# Patient Record
Sex: Male | Born: 1945 | Race: Black or African American | Hispanic: No | Marital: Married | Smoking: Never smoker
Health system: Southern US, Community
[De-identification: ages and names within clinical notes are randomized; demographics above are authoritative.]

## PROBLEM LIST (undated history)

## (undated) DIAGNOSIS — I4891 Unspecified atrial fibrillation: Secondary | ICD-10-CM

## (undated) DIAGNOSIS — I10 Essential (primary) hypertension: Secondary | ICD-10-CM

## (undated) DIAGNOSIS — G4733 Obstructive sleep apnea (adult) (pediatric): Secondary | ICD-10-CM

## (undated) DIAGNOSIS — C189 Malignant neoplasm of colon, unspecified: Secondary | ICD-10-CM

## (undated) DIAGNOSIS — E7800 Pure hypercholesterolemia, unspecified: Secondary | ICD-10-CM

## (undated) DIAGNOSIS — N183 Chronic kidney disease, stage 3 (moderate): Secondary | ICD-10-CM

## (undated) DIAGNOSIS — R011 Cardiac murmur, unspecified: Secondary | ICD-10-CM

## (undated) DIAGNOSIS — D509 Iron deficiency anemia, unspecified: Secondary | ICD-10-CM

## (undated) DIAGNOSIS — H269 Unspecified cataract: Secondary | ICD-10-CM

## (undated) HISTORY — PX: COLON SURGERY: SHX602

## (undated) HISTORY — DX: Cardiac murmur, unspecified: R01.1

## (undated) HISTORY — DX: Chronic kidney disease, stage 3 (moderate): N18.3

## (undated) HISTORY — DX: Malignant neoplasm of colon, unspecified: C18.9

## (undated) HISTORY — DX: Iron deficiency anemia, unspecified: D50.9

## (undated) HISTORY — DX: Pure hypercholesterolemia, unspecified: E78.00

## (undated) HISTORY — PX: BACK SURGERY: SHX140

## (undated) HISTORY — DX: Unspecified atrial fibrillation: I48.91

## (undated) HISTORY — DX: Unspecified cataract: H26.9

## (undated) HISTORY — PX: HERNIA REPAIR: SHX51

## (undated) HISTORY — DX: Obstructive sleep apnea (adult) (pediatric): G47.33

## (undated) HISTORY — PX: SPINE SURGERY: SHX786

---

## 1995-11-01 DIAGNOSIS — C189 Malignant neoplasm of colon, unspecified: Secondary | ICD-10-CM

## 1995-11-01 HISTORY — DX: Malignant neoplasm of colon, unspecified: C18.9

## 2002-10-31 HISTORY — PX: COLONOSCOPY: SHX5424

## 2008-04-24 ENCOUNTER — Ambulatory Visit: Payer: Self-pay | Admitting: Cardiology

## 2008-05-05 ENCOUNTER — Ambulatory Visit: Payer: Self-pay | Admitting: Cardiology

## 2008-06-17 ENCOUNTER — Ambulatory Visit: Payer: Self-pay | Admitting: Cardiology

## 2008-06-20 ENCOUNTER — Ambulatory Visit: Payer: Self-pay | Admitting: Cardiology

## 2011-03-15 NOTE — Assessment & Plan Note (Signed)
[facility]                          [facility]ICE NOTE   NAME:Suleiman, TYNER CODNER                      MRN:          [id number]  DATE:04/24/2008                            DOB:          Mar 08, 1946    REASON FOR CONSULTATION:  Evaluation of an abnormal electrocardiogram,  rule out significant heart disease.   HISTORY PRESENT ILLNESS:  The patient is a very pleasant 65 year old  African American male.  The patient has prior history of hypertension  and prostate cancer as well as sleep apnea.  The patient reports no  chest pain, shortness of breath, orthopnea, or PND.  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  He has no  palpitations or syncope.  He was told however that at age 10 during his  service years that he had an irregular heartbeat.  This has been  probably documented as the patient has PACs.  Also, on the current EKG  the patient has occasional evidence of atrial bigeminy.  However, he is  entirely asymptomatic with this.  Blood pressure, however, remains  uncontrolled.  His blood pressure was 150/99 in the office today.  He  takes also p.r.n. hydrochlorothiazide in addition to his  antihypertensive medications listed below.   ALLERGIES:  No known drug allergies.   SOCIAL HISTORY:  The patient does not smoke or drink alcohol.   FAMILY HISTORY:  Notable for mother died from lung disease.  Father  died of unknown causes.  The patient has 2 brothers, one is healthy, the  other brother has had a stroke.   MEDICATIONS:  1. Avodart 0.5 mg p.o. daily.  2. Aspirin 81 mg p.o. daily.  3. Amlodipine.  4. Benazepril 10/20 mg p.o. daily.   REVIEW OF SYSTEMS:  The patient denies any fever or chills.  No nausea  or vomiting.  No hematochezia.  No dysuria or frequency.  No  palpitations or syncope.  The remainder of review of systems is  negative.   PHYSICAL EXAMINATION:  VITAL SIGNS:  Blood pressure 153/99,  heart rate  60 beats per minute, weight is 210 pounds.  GENERAL:  Well-nourished African American male in no apparent distress.  HEENT:  Pupils:  Eyes are PERRL.  Conjunctivae clear.  NECK:  Supple with normal carotid upstroke.  No carotid bruits.  No JVD.  No thyromegaly.  LYMPHATICS:  No adenopathy in neck, axilla, or groin.  LUNGS:  Clear breath sounds bilaterally.  HEART:  Regular rate and rhythm.  Normal S1 and S2.  No pathological  murmurs.  ABDOMEN:  Soft, nontender.  No rebound or guarding.  No pulsatile mass.  EXTREMITIES:  No cyanosis, clubbing, or edema.  NEUROLOGIC:  Patient alert and oriented and grossly nonfocal.  SKIN:  Within normal limits.   A 12-lead electrocardiogram with normal sinus rhythm with PACs, left  ventricular hypertrophy, and T-wave changes possibly secondary to  repolarization changes.   PROBLEM LIST:  1. Rule out hypertensive heart disease.  2. Abnormal electrocardiogram.      a.     Rule out repolarization  abnormality.      b.     Rule out ischemic heart disease.  3. Hypertension, poorly controlled.  4. Premature atrial contractions, chronic and asymptomatic.  5. Obstructive sleep apnea.   PLAN:  1. Likely, the patient has hypertensive heart disease accounting for      his T-wave changes.  He has definite evidence of left ventricular      hypertrophy and likely has hypertensive heart disease.  I have      asked the patient to take hydrochlorothiazide daily in addition to      his current medications.  2. We have scheduled the patient for a stress Cardiolite study to rule      out underlying ischemic heart disease, although I think this      unlikely.  3. An echocardiographic study has been ordered to rule out structural      heart disease.  4. The patient can follow up with [place] in the office in the next couple      of months or earlier if he has an abnormal study.     Learta Codding, MD,FACC  Electronically Signed    GED/MedQ  DD:  04/24/2008  DT: 04/25/2008  Job #: [id number]   cc:   Dorise Hiss, M.D.

## 2011-03-15 NOTE — Assessment & Plan Note (Signed)
[facility]                          [facility]ICE NOTE   NAME:Benjamin Chaney, ANTINIO SANDERFER                      MRN:          [id number]  DATE:06/20/2008                            DOB:          04/03/1946    CARDIOLOGIST:  Learta Codding, MD,FACC   PRIMARY CARE PHYSICIAN:  Dorise Hiss, MD   REASON FOR VISIT:  A 62-month followup.   HISTORY OF PRESENT ILLNESS:  Mr. Callens is very pleasant 65 year old  male patient with a history of hypertension, who saw Dr. Andee Lineman back in  June for an abnormal EKG.  To work up the patient's abnormal EKG, he had  an echocardiogram performed which revealed normal LV function with an EF  of 55%-60%.  He did have some abnormal diastolic filling as well as a  moderately dilated left atrium.  His stress test revealed a mild  inferior defect likely secondary to soft tissue attenuation.  There were  no large areas of ischemia.  He did develop LV chamber dilatation and  SVT postexercise.  His EF was greater than 42%.  Given the patient's SVT  postexercise, he was set up for an event monitor.  I have just reviewed  all of those strips in the office today.  He had several episodes of SVT  lasting just a few seconds.  The tech from [employer] actually described  these episodes as atrial fibrillation at times.  However, after further  review with Dr. Myrtis Ser, we think that these were all paroxysmal  supraventricular tachycardia.  In talking with the patient today, he is  basically asymptomatic with these arrhythmias.  He occasionally notes  some palpitations.  He has noted these for most of his life.  He does  not have any episodes of syncope or near-syncope.  He denies chest pain  or shortness of breath.  He denies orthopnea, PND, or pedal edema.   CURRENT MEDICATIONS:  1. Avodart 0.5 mg daily.  2. Aspirin 81 mg daily.  3. Amlodipine/benazepril 10/20 mg daily.  4. Hydrochlorothiazide 25 mg daily.   PHYSICAL EXAMINATION:   GENERAL:  He is a well-nourished, well-developed  male.  VITAL SIGNS:  Blood pressure is 157/96, pulse 57, weight 205 pounds.  HEENT:  Normal.  NECK:  Without JVD.  CARDIAC:  Normal S1 and S2.  Regular rate and rhythm.  LUNGS:  Clear to auscultation bilaterally.  ABDOMEN:  Soft, nontender.  EXTREMITIES:  Without edema.  NEUROLOGIC:  He is alert and oriented x3.  Cranial nerves II through XII  are grossly intact.   ASSESSMENT AND PLAN:  1. Paroxysmal supraventricular tachycardia, rule out atrioventricular      nodal reentrant tachycardia.  As noted above, I reviewed the      patient's monitor with Dr. Myrtis Ser today.  For the most part, it      appears that he has had paroxysmal supraventricular tachycardia.      There were some episodes that looked suspicious for atrial      fibrillation, although these were brief.  He is already on aspirin      therapy and  his CHADS2 score is only 1 (hypertension).  He will      remain on aspirin for now.  For the most part, he is asymptomatic.      We do not feel that he needs referral to Electrophysiology at this      point in time.  His medications will be changed to add an      atrioventricular nodal blocking agent.  He just filled his current      prescription of amlodipine/benazepril.  When he is finished with      that bottle, he will stop amlodipine and start diltiazem CD 120 mg      daily.  We will also increase his benazepril to 40 mg a day.  He      will have a followup BMET and blood pressure and heart rate check 1      week later.  I will bring the patient back in followup with Dr.      Andee Lineman in 3-4 months for followup.  2. Hypertension.  As noted above, we will adjust his dose of      benazepril for better blood pressure control.   DISPOSITION:  Follow up in 3-4 months or sooner with Dr. Andee Lineman.      Tereso Newcomer, PA-C  Electronically Signed      Luis Abed, MD, [facility]  Electronically Signed   SW/MedQ  DD: 06/20/2008  DT:  06/21/2008  Job #: [id number]   cc:   Dorise Hiss, M.D.

## 2011-12-26 ENCOUNTER — Emergency Department (HOSPITAL_COMMUNITY)
Admission: EM | Admit: 2011-12-26 | Discharge: 2011-12-26 | Disposition: A | Discharge status: Home Health | Payer: Managed Care, Other (non HMO) | Attending: Emergency Medicine | Admitting: Emergency Medicine

## 2011-12-26 ENCOUNTER — Encounter (HOSPITAL_COMMUNITY): Payer: Self-pay | Admitting: *Deleted

## 2011-12-26 DIAGNOSIS — Z79899 Other long term (current) drug therapy: Secondary | ICD-10-CM | POA: Insufficient documentation

## 2011-12-26 DIAGNOSIS — R197 Diarrhea, unspecified: Secondary | ICD-10-CM | POA: Insufficient documentation

## 2011-12-26 DIAGNOSIS — I10 Essential (primary) hypertension: Secondary | ICD-10-CM | POA: Insufficient documentation

## 2011-12-26 HISTORY — DX: Essential (primary) hypertension: I10

## 2011-12-26 NOTE — Discharge Instructions (Signed)
Take your blood pressure medication when you return home this evening and then start them again in the morning.  Follow up with your primary care doctor if your pressure continues to run high.  You should return to the ER if you develop severe headache, blurred vision, chest pain or difficulty breathing. Hypertension Hypertension is another name for high blood pressure. High blood pressure may mean that your heart needs to work harder to pump blood. Blood pressure consists of two numbers, which includes a higher number over a lower number (example: 110/72). HOME CARE   Make lifestyle changes as told by your doctor. This may include weight loss and exercise.   Take your blood pressure medicine every day.   Limit how much salt you use.   Stop smoking if you smoke.   Do not use drugs.   Talk to your doctor if you are using decongestants or birth control pills. These medicines might make blood pressure higher.   Females should not drink more than 1 alcoholic drink per day. Males should not drink more than 2 alcoholic drinks per day.   See your doctor as told.  GET HELP RIGHT AWAY IF:   You have a blood pressure reading with a top number of 180 or higher.   You get a very bad headache.   You get blurred or changing vision.   You feel confused.   You feel weak, numb, or faint.   You get chest or belly (abdominal) pain.   You throw up (vomit).   You cannot breathe very well.  MAKE SURE YOU:   Understand these instructions.   Will watch your condition.   Will get help right away if you are not doing well or get worse.  Document Released: 04/04/2008 Document Revised: 06/29/2011 Document Reviewed: 04/04/2008 [facility] Patient Information 2012 [place], [place].

## 2011-12-26 NOTE — ED Provider Notes (Signed)
History     CSN: [id number]  Arrival date & time 12/26/11  1544   First MD Initiated Contact with Patient 12/26/11 1921      Chief Complaint  Patient presents with  . Hypertension    (Consider location/radiation/quality/duration/timing/severity/associated sxs/prior treatment) HPI History provided by pt.   Pt had a follow up visit with urologist today and blood pressure was 210 systolic at the office.  Was referred to ED for evaluation.  Pt takes diltiazem, benazepril and avodart and is generally compliant, but did not take his medications this morning.  He developed diarrhea shortly after taking them yesterday and thought it might be a side effect.  Has been on these medications for several years.  Last PMD visit 10/2011 and BP controlled at that time.  Has no complaints currently.  Denies headache, blurred vision, chest pain, shortness of breath.    Past Medical History  Diagnosis Date  . Hypertension     History reviewed. No pertinent past surgical history.  No family history on file.  History  Substance Use Topics  . Smoking status: Not on file  . Smokeless tobacco: Not on file  . Alcohol Use:       Review of Systems  All other systems reviewed and are negative.    Allergies  Review of patient's allergies indicates no known allergies.  Home Medications   Current Outpatient Rx  Name Route Sig Dispense Refill  . BENAZEPRIL HCL 10 MG PO TABS Oral Take 10 mg by mouth daily.    Marland Kitchen DILTIAZEM HCL ER 240 MG PO CP24 Oral Take 240 mg by mouth daily.    . DUTASTERIDE 0.5 MG PO CAPS Oral Take 0.5 mg by mouth daily.      BP 193/119  Pulse 66  Temp(Src) 98.8 F (37.1 C) (Oral)  Resp 16  SpO2 98%  Physical Exam  Nursing note and vitals reviewed. Constitutional: He is oriented to person, place, and time. He appears well-developed and well-nourished. No distress.  HENT:  Head: Normocephalic and atraumatic.  Eyes:       Normal appearance  Neck: Normal range of  motion.  Cardiovascular: Normal rate and regular rhythm.        hypertensive  Pulmonary/Chest: Effort normal and breath sounds normal.  Musculoskeletal: Normal range of motion.  Neurological: He is alert and oriented to person, place, and time. He has normal reflexes. No cranial nerve deficit or sensory deficit. Coordination normal.       5/5 and equal upper and lower extremity strength.  No past pointing.     Skin: Skin is warm and dry. No rash noted.  Psychiatric: He has a normal mood and affect. His behavior is normal.    ED Course  Procedures (including critical care time)  Labs Reviewed - No data to display No results found.   1. Hypertension       MDM  Pt referred to ED for elevated BP.  Did not take his medications this morning.  No complaints currently.  Hypertensive, heart w/ RRR, lungs CTA, no focal neuro deficits on exam.  Pt advised to take his BP medications as soon as he returns home, take them tomorrow morning as scheduled and maintain compliance.  He will check his BP at pharmacy w/in the next 48hrs and f/u with PCP if BP continues to run high.  Return precautions discussed.         Otilio Miu, [place] 12/26/11 2012

## 2011-12-26 NOTE — ED Notes (Signed)
To ed for eval of htn. Pt states he didn't take his bp meds today due to having diarrhea

## 2011-12-29 NOTE — ED Provider Notes (Signed)
Medical screening examination/treatment/procedure(s) were performed by non-physician practitioner and as supervising physician I was immediately available for consultation/collaboration.   Gwyneth Sprout, MD 12/29/11 [phone]

## 2012-12-26 DIAGNOSIS — N183 Chronic kidney disease, stage 3 unspecified: Secondary | ICD-10-CM | POA: Insufficient documentation

## 2012-12-26 DIAGNOSIS — I10 Essential (primary) hypertension: Secondary | ICD-10-CM | POA: Insufficient documentation

## 2012-12-26 HISTORY — DX: Chronic kidney disease, stage 3 unspecified: N18.30

## 2014-03-05 ENCOUNTER — Other Ambulatory Visit: Payer: Self-pay | Admitting: Gastroenterology

## 2014-03-05 ENCOUNTER — Ambulatory Visit (INDEPENDENT_AMBULATORY_CARE_PROVIDER_SITE_OTHER): Payer: Managed Care, Other (non HMO) | Admitting: Gastroenterology

## 2014-03-05 ENCOUNTER — Encounter (INDEPENDENT_AMBULATORY_CARE_PROVIDER_SITE_OTHER): Payer: Self-pay

## 2014-03-05 ENCOUNTER — Encounter: Payer: Self-pay | Admitting: Gastroenterology

## 2014-03-05 ENCOUNTER — Encounter (HOSPITAL_COMMUNITY): Payer: Self-pay

## 2014-03-05 VITALS — BP 126/80 | HR 70 | Temp 97.9°F | Ht 69.0 in | Wt 182.6 lb

## 2014-03-05 DIAGNOSIS — R195 Other fecal abnormalities: Secondary | ICD-10-CM

## 2014-03-05 DIAGNOSIS — Z85038 Personal history of other malignant neoplasm of large intestine: Secondary | ICD-10-CM

## 2014-03-05 NOTE — Patient Instructions (Addendum)
Please complete the stool samples as soon as possible and take to the lab.  We have scheduled you for a colonoscopy with [name] in the near future.   Start taking a probiotic daily. Some examples over the counter include Align, Restora, Digestive Advantage, Phillip's Colon Health, [employer] brand.

## 2014-03-05 NOTE — Assessment & Plan Note (Addendum)
68 year old male with chronic diarrhea of unknown etiology; personal history of colon cancer in the remote past, with last surveillance in 2004. Overdue for surveillance. Self reported weight loss of 10 lbs in past month with associated lack of appetite. No other specific upper GI signs. Doubt infectious etiology but will obtain Cdiff, stool culture, and Giardia for completeness. Add fecal elastase test. Differentials include occult malignancy, IBS, pancreatic insufficiency, unable to exclude microscopic colitis but less likely.   Proceed with colonoscopy with [name] in the near future. The risks, benefits, and alternatives have been discussed in detail with the patient. They state understanding and desire to proceed.  Stool studies as outlined Discussed hernia care with patient: doubt he would be a good operative candidate.

## 2014-03-05 NOTE — Progress Notes (Signed)
cc'd to pcp 

## 2014-03-05 NOTE — Progress Notes (Signed)
Primary Care Physician:  Deloria Lair, MD Primary Gastroenterologist:  [name]   Chief Complaint  Patient presents with  . Diarrhea    HPI:   Benjamin Chaney presents today as a new patient with a remote history of colon cancer in 1997 requiring left hemicolectomy. It appears his last colonoscopy was in 2004 by Dr. Lindalou Hose and normal. States he had a big case of diarrhea, chronic, at least 2 years. Like water, not getting any better. Last month or so has noted some improvement. Baseline bowel habits once daily. Notes 2 year history of diarrhea. Prior to this, BM daily. Didn't think anything of it at first. 3-4 loose stools per day now. Sometimes greasy. No aggravating factors. Sometimes after eating notes periumbilical pain at hernia. No rectal bleeding. Notes lack of appetite. Weight loss of about 10 lbs in last month. Believes this is due to lack of appetite. No N/V. No dysphagia. No reflux symptoms. No recent exposure to antibiotics. Has city water.    Past Medical History  Diagnosis Date  . Hypertension   . Colon cancer 1997    left hemicolectomy   . Hypercholesterolemia     Past Surgical History  Procedure Laterality Date  . Colon surgery    . Back surgery    . Colonoscopy  2004    Dr. Lindalou Hose: normal  . Hernia repair      in his 90s    Current Outpatient Prescriptions  Medication Sig Dispense Refill  . aspirin 325 MG tablet Take 325 mg by mouth daily.      . benazepril (LOTENSIN) 40 MG tablet Take 40 mg by mouth 2 (two) times daily.       . Cholecalciferol (VITAMIN D-3) 5000 UNITS TABS Take 5,000 Units by mouth daily.      Marland Kitchen diltiazem (DILACOR XR) 240 MG 24 hr capsule Take 240 mg by mouth daily.      Marland Kitchen doxazosin (CARDURA) 4 MG tablet Take 4 mg by mouth daily.       Marland Kitchen dutasteride (AVODART) 0.5 MG capsule Take 0.5 mg by mouth daily.      . Red Yeast Rice Extract (RED YEAST RICE PO) Take by mouth daily.       No current facility-administered medications for  this visit.    Allergies as of 03/05/2014  . (No Known Allergies)    Family History  Problem Relation Age of Onset  . Colon cancer Neg Hx     History   Social History  . Marital Status: Married    Spouse Name: N/A    Number of Children: N/A  . Years of Education: N/A   Occupational History  . Not on file.   Social History Main Topics  . Smoking status: Never Smoker   . Smokeless tobacco: Not on file  . Alcohol Use: No  . Drug Use: No  . Sexual Activity: Not on file   Other Topics Concern  . Not on file   Social History Narrative  . No narrative on file    Review of Systems: As mentioned in HPI  Physical Exam: BP 126/80  Pulse 70  Temp(Src) 97.9 F (36.6 C) (Oral)  Ht 5\' 9"  (1.753 m)  Wt 182 lb 9.6 oz (82.827 kg)  BMI 26.95 kg/m2 General:   Alert and oriented. Pleasant and cooperative. Well-nourished and well-developed.  Head:  Normocephalic and atraumatic. Eyes:  Without icterus, sclera clear and conjunctiva pink.  Ears:  Normal auditory  acuity. Nose:  No deformity, discharge,  or lesions. Mouth:  No deformity or lesions, oral mucosa pink.  Lungs:  Clear to auscultation bilaterally. No wheezes, rales, or rhonchi. No distress.  Heart:  S1, S2 present without murmurs appreciated.  Abdomen:  +BS, soft, large ventral hernia, easily reducible, non-tender. No HSM noted. No guarding or rebound.  Rectal:  Deferred  Msk:  Symmetrical without gross deformities. Normal posture. Extremities:  Without clubbing or edema. Neurologic:  Alert and  oriented x4;  grossly normal neurologically. Skin:  Intact without significant lesions or rashes. Psych:  Alert and cooperative. Normal mood and affect.

## 2014-03-05 NOTE — Assessment & Plan Note (Signed)
Overdue for surveillance. Proceed with colonoscopy now.

## 2014-03-07 LAB — CLOSTRIDIUM DIFFICILE BY PCR: CDIFFPCR: NOT DETECTED

## 2014-03-07 LAB — GIARDIA ANTIGEN: GIARDIA SCREEN (EIA): NEGATIVE

## 2014-03-10 LAB — STOOL CULTURE

## 2014-03-11 ENCOUNTER — Ambulatory Visit (HOSPITAL_COMMUNITY)
Admission: RE | Admit: 2014-03-11 | Discharge: 2014-03-11 | Disposition: A | Discharge status: Home / Self Care | Payer: Managed Care, Other (non HMO) | Source: Ambulatory Visit | Attending: Gastroenterology | Admitting: Gastroenterology

## 2014-03-11 ENCOUNTER — Encounter (HOSPITAL_COMMUNITY)
Admission: RE | Disposition: A | Discharge status: Home / Self Care | Payer: Self-pay | Source: Ambulatory Visit | Attending: Gastroenterology

## 2014-03-11 ENCOUNTER — Encounter (HOSPITAL_COMMUNITY): Payer: Self-pay | Admitting: *Deleted

## 2014-03-11 DIAGNOSIS — I10 Essential (primary) hypertension: Secondary | ICD-10-CM | POA: Insufficient documentation

## 2014-03-11 DIAGNOSIS — K620 Anal polyp: Secondary | ICD-10-CM | POA: Insufficient documentation

## 2014-03-11 DIAGNOSIS — K648 Other hemorrhoids: Secondary | ICD-10-CM | POA: Insufficient documentation

## 2014-03-11 DIAGNOSIS — D128 Benign neoplasm of rectum: Secondary | ICD-10-CM

## 2014-03-11 DIAGNOSIS — R195 Other fecal abnormalities: Secondary | ICD-10-CM

## 2014-03-11 DIAGNOSIS — Z85038 Personal history of other malignant neoplasm of large intestine: Secondary | ICD-10-CM | POA: Insufficient documentation

## 2014-03-11 DIAGNOSIS — Z9049 Acquired absence of other specified parts of digestive tract: Secondary | ICD-10-CM | POA: Insufficient documentation

## 2014-03-11 DIAGNOSIS — R197 Diarrhea, unspecified: Secondary | ICD-10-CM | POA: Insufficient documentation

## 2014-03-11 DIAGNOSIS — D129 Benign neoplasm of anus and anal canal: Secondary | ICD-10-CM

## 2014-03-11 DIAGNOSIS — K5730 Diverticulosis of large intestine without perforation or abscess without bleeding: Secondary | ICD-10-CM | POA: Insufficient documentation

## 2014-03-11 DIAGNOSIS — K621 Rectal polyp: Secondary | ICD-10-CM

## 2014-03-11 DIAGNOSIS — E7800 Pure hypercholesterolemia, unspecified: Secondary | ICD-10-CM | POA: Insufficient documentation

## 2014-03-11 HISTORY — PX: COLONOSCOPY: SHX5424

## 2014-03-11 SURGERY — COLONOSCOPY
Anesthesia: Moderate Sedation

## 2014-03-11 MED ORDER — MEPERIDINE HCL 100 MG/ML IJ SOLN
INTRAMUSCULAR | Status: AC
  Filled 2014-03-11: qty 2

## 2014-03-11 MED ORDER — MIDAZOLAM HCL 5 MG/5ML IJ SOLN
INTRAMUSCULAR | Status: DC | PRN
  Administered 2014-03-11: 2 mg via INTRAVENOUS

## 2014-03-11 MED ORDER — MIDAZOLAM HCL 5 MG/5ML IJ SOLN
INTRAMUSCULAR | Status: AC
  Filled 2014-03-11: qty 10

## 2014-03-11 MED ORDER — SODIUM CHLORIDE 0.9 % IV SOLN
INTRAVENOUS | Status: DC
  Administered 2014-03-11: 12:00:00 via INTRAVENOUS

## 2014-03-11 MED ORDER — STERILE WATER FOR IRRIGATION IR SOLN
Status: DC | PRN
  Administered 2014-03-11: 12:00:00

## 2014-03-11 MED ORDER — MEPERIDINE HCL 100 MG/ML IJ SOLN
INTRAMUSCULAR | Status: DC | PRN
  Administered 2014-03-11: 25 mg via INTRAVENOUS

## 2014-03-11 NOTE — Progress Notes (Signed)
Quick Note:  Negative stool studies.  Awaiting fecal elastase test. ______

## 2014-03-11 NOTE — Op Note (Signed)
[facility] [address] [place], [address]   COLONOSCOPY PROCEDURE REPORT  PATIENT: Benjamin Chaney, Benjamin Chaney.  MR#: [id number] BIRTHDATE: [birth year] , 68  yrs. old GENDER: Male ENDOSCOPIST: Barney Drain, MD REFERRED EX:BMWUX Tapper, M.D. PROCEDURE DATE:  03/11/2014 PROCEDURE:   Colonoscopy with biopsy and with cold biopsy polypectomy INDICATIONS:Change in bowel habits and unexplained diarrhea. MEDICATIONS: Demerol 25 mg IV and Versed 2 mg IV  DESCRIPTION OF PROCEDURE:    Physical exam was performed.  Informed consent was obtained from the patient after explaining the benefits, risks, and alternatives to procedure.  The patient was connected to monitor and placed in left lateral position. Continuous oxygen was provided by nasal cannula and IV medicine administered through an indwelling cannula.  After administration of sedation and rectal exam, the patients rectum was intubated and the EC-[id number] ([id number])  colonoscope was advanced under direct visualization to the ileum.  The scope was removed slowly by carefully examining the color, texture, anatomy, and integrity mucosa on the way out.  The patient was recovered in endoscopy and discharged home in satisfactory condition.    COLON FINDINGS: The mucosa appeared normal in the terminal ileum.  , Mild diverticulosis was noted in the ascending colon.  , A sessile polyp measuring 4 mm in size was found in the rectum.  A polypectomy was performed with cold forceps. otherwise normal colon. Cold forceps biopsies obtained TO EVALUATE FOR MICROSCOPIC COLITIS. Small internal hemorrhoids were found.   NORMAL ANASTOMOSIS  30 CM FROM THE ANAL VERGE.  PREP QUALITY: good.  CECAL W/D TIME: 15 minutes     COMPLICATIONS: None  ENDOSCOPIC IMPRESSION: 1.   Normal mucosa in the terminal ileum 2.   Mild diverticulosis in the ascending colon 3.   ONE SMALL RECTAL POLYP REMOVED 4.   Small internal hemorrhoids 5.   NORMAL  ANASTOMOSIS  RECOMMENDATIONS: USE DICYCLOMINE 10 MG TABLETS ONE OR TWO 30 MINUTES BEFORE BREAKFAST AND LUNCH TO PREVENT DIARRHEA.  MED WARNINGS GIVEN. FOLLOW A HIGH FIBER/LOW FAT DIET.  AVOID ITEMS THAT CAUSE BLOATING.  BIOPSY RESULTS SHOULD BE BACK IN 7 DAYS. FOLLOW UP IN 3 MOS.  Next colonoscopy in 5 years.      _______________________________ Lorrin MaisBarney Drain, MD 03/11/2014 12:39 PM

## 2014-03-11 NOTE — H&P (Signed)
  Primary Care Physician:  Deloria Lair, MD Primary Gastroenterologist:  [name]  Pre-Procedure History & Physical: HPI:  Benjamin Chaney is a 68 y.o. male here for Change in bowel habits.  Past Medical History  Diagnosis Date  . Hypertension   . Colon cancer 1997    left hemicolectomy   . Hypercholesterolemia     Past Surgical History  Procedure Laterality Date  . Colon surgery    . Back surgery    . Colonoscopy  2004    Dr. Lindalou Hose: normal  . Hernia repair      in his 64s    Prior to Admission medications   Medication Sig Start Date End Date Taking? Authorizing Provider  aspirin 325 MG tablet Take 325 mg by mouth daily.   Yes Historical Provider, MD  benazepril (LOTENSIN) 40 MG tablet Take 20-40 mg by mouth 2 (two) times daily. 1 tablet in am and 1/2 tablet at night 02/19/14  Yes Historical Provider, MD  CARTIA XT 240 MG 24 hr capsule Take 1 capsule by mouth daily. 02/27/14  Yes Historical Provider, MD  Cholecalciferol (VITAMIN D-3) 5000 UNITS TABS Take 5,000 Units by mouth daily.   Yes Historical Provider, MD  doxazosin (CARDURA) 4 MG tablet Take 4 mg by mouth at bedtime.  03/01/14  Yes Historical Provider, MD  dutasteride (AVODART) 0.5 MG capsule Take 0.5 mg by mouth daily.   Yes Historical Provider, MD  furosemide (LASIX) 40 MG tablet Take 40 mg by mouth daily.   Yes Historical Provider, MD  Red Yeast Rice Extract (RED YEAST RICE PO) Take 1 tablet by mouth once a week.    Yes Historical Provider, MD    Allergies as of 03/05/2014  . (No Known Allergies)    Family History  Problem Relation Age of Onset  . Colon cancer Neg Hx     History   Social History  . Marital Status: Married    Spouse Name: N/A    Number of Children: N/A  . Years of Education: N/A   Occupational History  . Not on file.   Social History Main Topics  . Smoking status: Never Smoker   . Smokeless tobacco: Not on file  . Alcohol Use: No  . Drug Use: No  . Sexual Activity: Not on file    Other Topics Concern  . Not on file   Social History Narrative  . No narrative on file    Review of Systems: See HPI, otherwise negative ROS   Physical Exam: BP 109/89  Pulse 74  Temp(Src) 97.8 F (36.6 C) (Oral)  Resp 17  Ht 5\' 9"  (1.753 m)  Wt 182 lb (82.555 kg)  BMI 26.86 kg/m2  SpO2 100% General:   Alert,  pleasant and cooperative in NAD Head:  Normocephalic and atraumatic. Neck:  Supple; Lungs:  Clear throughout to auscultation.    Heart:  IRRegular rhythm. Abdomen:  Soft, nontender and nondistended. Normal bowel sounds, without guarding, and without rebound.   Neurologic:  Alert and  oriented x4;  grossly normal neurologically.  Impression/Plan:   Change in bowel habits  PLAN:  1. TCS TODAY

## 2014-03-11 NOTE — Progress Notes (Signed)
REVIEWED.  

## 2014-03-11 NOTE — Discharge Instructions (Signed)
THE LAST PART OF YOUR SMALL BOWEL IS NORMAL. NO SOURCE FOR YOUR LOOSE STOOLS WAS IDENTIFIED. You had 1 small polyp removed. You have SMALL internal hemorrhoids and diverticulosis IN YOUR RIGHT COLON. I BIOPSIED YOUR COLON.   Continue RESTORA.  YOU CAN USE DICYCLOMINE 10 MG TABLETS ONE 30 MINUTES BEFORE BREAKFAST AND LUNCH TO PREVENT DIARRHEA. IT MAY CAUSE DROWSINESS, DRY EYES/MOUTH, BLURRY VISION, OR DIFFICULTY URINATING.  FOLLOW A HIGH FIBER/LOW FAT DIET. AVOID ITEMS THAT CAUSE BLOATING. SEE INFO BELOW.  YOUR BIOPSY RESULTS SHOULD BE BACK IN 7 DAYS.  FOLLOW UP IN 3 MOS.   Next colonoscopy in 5 years.   Colonoscopy Care After Read the instructions outlined below and refer to this sheet in the next week. These discharge instructions provide you with general information on caring for yourself after you leave the hospital. While your treatment has been planned according to the most current medical practices available, unavoidable complications occasionally occur. If you have any problems or questions after discharge, call DR. Lorrene Graef, [phone].  ACTIVITY  You may resume your regular activity, but move at a slower pace for the next 24 hours.   Take frequent rest periods for the next 24 hours.   Walking will help get rid of the air and reduce the bloated feeling in your belly (abdomen).   No driving for 24 hours (because of the medicine (anesthesia) used during the test).   You may shower.   Do not sign any important legal documents or operate any machinery for 24 hours (because of the anesthesia used during the test).    NUTRITION  Drink plenty of fluids.   You may resume your normal diet as instructed by your doctor.   Begin with a light meal and progress to your normal diet. Heavy or fried foods are harder to digest and may make you feel sick to your stomach (nauseated).   Avoid alcoholic beverages for 24 hours or as instructed.    MEDICATIONS  You may resume your  normal medications.   WHAT YOU CAN EXPECT TODAY  Some feelings of bloating in the abdomen.   Passage of more gas than usual.   Spotting of blood in your stool or on the toilet paper  .  IF YOU HAD POLYPS REMOVED DURING THE COLONOSCOPY:  Eat a soft diet IF YOU HAVE NAUSEA, BLOATING, ABDOMINAL PAIN, OR VOMITING.    FINDING OUT THE RESULTS OF YOUR TEST Not all test results are available during your visit. [name] WILL CALL YOU WITHIN 7 DAYS OF YOUR PROCEDUE WITH YOUR RESULTS. Do not assume everything is normal if you have not heard from DR. Abrianna Sidman IN ONE WEEK, CALL HER OFFICE AT [phone].  SEEK IMMEDIATE MEDICAL ATTENTION AND CALL THE OFFICE: [phone] IF:  You have more than a spotting of blood in your stool.   Your belly is swollen (abdominal distention).   You are nauseated or vomiting.   You have a temperature over 101F.   You have abdominal pain or discomfort that is severe or gets worse throughout the day.  Polyps, Colon  A polyp is extra tissue that grows inside your body. Colon polyps grow in the large intestine. The large intestine, also called the colon, is part of your digestive system. It is a long, hollow tube at the end of your digestive tract where your body makes and stores stool. Most polyps are not dangerous. They are benign. This means they are not cancerous. But over time, some types of  polyps can turn into cancer. Polyps that are smaller than a pea are usually not harmful. But larger polyps could someday become or may already be cancerous. To be safe, doctors remove all polyps and test them.   WHO GETS POLYPS? Anyone can get polyps, but certain people are more likely than others. You may have a greater chance of getting polyps if:  You are over 50.   You have had polyps before.   Someone in your family has had polyps.   Someone in your family has had cancer of the large intestine.   Find out if someone in your family has had polyps. You may  also be more likely to get polyps if you:   Eat a lot of fatty foods   Smoke   Drink alcohol   Do not exercise  Eat too much   TREATMENT  The caregiver will remove the polyp during sigmoidoscopy or colonoscopy.  PREVENTION There is not one sure way to prevent polyps. You might be able to lower your risk of getting them if you:  Eat more fruits and vegetables and less fatty food.   Do not smoke.   Avoid alcohol.   Exercise every day.   Lose weight if you are overweight.   Eating more calcium and folate can also lower your risk of getting polyps. Some foods that are rich in calcium are milk, cheese, and broccoli. Some foods that are rich in folate are chickpeas, kidney beans, and spinach.   High-Fiber Diet A high-fiber diet changes your normal diet to include more whole grains, legumes, fruits, and vegetables. Changes in the diet involve replacing refined carbohydrates with unrefined foods. The calorie level of the diet is essentially unchanged. The Dietary Reference Intake (recommended amount) for adult males is 38 grams per day. For adult females, it is 25 grams per day. Pregnant and lactating women should consume 28 grams of fiber per day. Fiber is the intact part of a plant that is not broken down during digestion. Functional fiber is fiber that has been isolated from the plant to provide a beneficial effect in the body. PURPOSE  Increase stool bulk.   Ease and regulate bowel movements.   Lower cholesterol.  INDICATIONS THAT YOU NEED MORE FIBER  Constipation and hemorrhoids.   Uncomplicated diverticulosis (intestine condition) and irritable bowel syndrome.   Weight management.   As a protective measure against hardening of the arteries (atherosclerosis), diabetes, and cancer.   GUIDELINES FOR INCREASING FIBER IN THE DIET  Start adding fiber to the diet slowly. A gradual increase of about 5 more grams (2 slices of whole-wheat bread, 2 servings of most fruits or  vegetables, or 1 bowl of high-fiber cereal) per day is best. Too rapid an increase in fiber may result in constipation, flatulence, and bloating.   Drink enough water and fluids to keep your urine clear or pale yellow. Water, juice, or caffeine-free drinks are recommended. Not drinking enough fluid may cause constipation.   Eat a variety of high-fiber foods rather than one type of fiber.   Try to increase your intake of fiber through using high-fiber foods rather than fiber pills or supplements that contain small amounts of fiber.   The goal is to change the types of food eaten. Do not supplement your present diet with high-fiber foods, but replace foods in your present diet.  INCLUDE A VARIETY OF FIBER SOURCES  Replace refined and processed grains with whole grains, canned fruits with fresh fruits, and  incorporate other fiber sources. White rice, white breads, and most bakery goods contain little or no fiber.   Brown whole-grain rice, buckwheat oats, and many fruits and vegetables are all good sources of fiber. These include: broccoli, Brussels sprouts, cabbage, cauliflower, beets, sweet potatoes, white potatoes (skin on), carrots, tomatoes, eggplant, squash, berries, fresh fruits, and dried fruits.   Cereals appear to be the richest source of fiber. Cereal fiber is found in whole grains and bran. Bran is the fiber-rich outer coat of cereal grain, which is largely removed in refining. In whole-grain cereals, the bran remains. In breakfast cereals, the largest amount of fiber is found in those with "bran" in their names. The fiber content is sometimes indicated on the label.   You may need to include additional fruits and vegetables each day.   In baking, for 1 cup white flour, you may use the following substitutions:   1 cup whole-wheat flour minus 2 tablespoons.   1/2 cup white flour plus 1/2 cup whole-wheat flour.   Low-Fat Diet BREADS, CEREALS, PASTA, RICE, DRIED PEAS, AND BEANS These  products are high in carbohydrates and most are low in fat. Therefore, they can be increased in the diet as substitutes for fatty foods. They too, however, contain calories and should not be eaten in excess. Cereals can be eaten for snacks as well as for breakfast.   FRUITS AND VEGETABLES It is good to eat fruits and vegetables. Besides being sources of fiber, both are rich in vitamins and some minerals. They help you get the daily allowances of these nutrients. Fruits and vegetables can be used for snacks and desserts.  MEATS Limit lean meat, chicken, [place], and fish to no more than 6 ounces per day. Beef, Pork, and Lamb Use lean cuts of beef, pork, and lamb. Lean cuts include:  Extra-lean ground beef.  Arm roast.  Sirloin tip.  Center-cut ham.  Round steak.  Loin chops.  Rump roast.  Tenderloin.  Trim all fat off the outside of meats before cooking. It is not necessary to severely decrease the intake of red meat, but lean choices should be made. Lean meat is rich in protein and contains a highly absorbable form of iron. Premenopausal women, in particular, should avoid reducing lean red meat because this could increase the risk for low red blood cells (iron-deficiency anemia).  Chicken and [place] These are good sources of protein. The fat of poultry can be reduced by removing the skin and underlying fat layers before cooking. Chicken and [place] can be substituted for lean red meat in the diet. Poultry should not be fried or covered with high-fat sauces. Fish and Shellfish Fish is a good source of protein. Shellfish contain cholesterol, but they usually are low in saturated fatty acids. The preparation of fish is important. Like chicken and [place], they should not be fried or covered with high-fat sauces. EGGS Egg whites contain no fat or cholesterol. They can be eaten often. Try 1 to 2 egg whites instead of whole eggs in recipes or use egg substitutes that do not contain yolk. MILK AND  DAIRY PRODUCTS Use skim or 1% milk instead of 2% or whole milk. Decrease whole milk, natural, and processed cheeses. Use nonfat or low-fat (2%) cottage cheese or low-fat cheeses made from vegetable oils. Choose nonfat or low-fat (1 to 2%) yogurt. Experiment with evaporated skim milk in recipes that call for heavy cream. Substitute low-fat yogurt or low-fat cottage cheese for sour cream in dips and salad  dressings. Have at least 2 servings of low-fat dairy products, such as 2 glasses of skim (or 1%) milk each day to help get your daily calcium intake. FATS AND OILS Reduce the total intake of fats, especially saturated fat. Butterfat, lard, and beef fats are high in saturated fat and cholesterol. These should be avoided as much as possible. Vegetable fats do not contain cholesterol, but certain vegetable fats, such as coconut oil, palm oil, and palm kernel oil are very high in saturated fats. These should be limited. These fats are often used in bakery goods, processed foods, popcorn, oils, and nondairy creamers. Vegetable shortenings and some peanut butters contain hydrogenated oils, which are also saturated fats. Read the labels on these foods and check for saturated vegetable oils. Unsaturated vegetable oils and fats do not raise blood cholesterol. However, they should be limited because they are fats and are high in calories. Total fat should still be limited to 30% of your daily caloric intake. Desirable liquid vegetable oils are corn oil, cottonseed oil, olive oil, canola oil, safflower oil, soybean oil, and sunflower oil. Peanut oil is not as good, but small amounts are acceptable. Buy a heart-healthy tub margarine that has no partially hydrogenated oils in the ingredients. Mayonnaise and salad dressings often are made from unsaturated fats, but they should also be limited because of their high calorie and fat content. Seeds, nuts, peanut butter, olives, and avocados are high in fat, but the fat is mainly  the unsaturated type. These foods should be limited mainly to avoid excess calories and fat. OTHER EATING TIPS Snacks  Most sweets should be limited as snacks. They tend to be rich in calories and fats, and their caloric content outweighs their nutritional value. Some good choices in snacks are graham crackers, melba toast, soda crackers, bagels (no egg), English muffins, fruits, and vegetables. These snacks are preferable to snack crackers, [place] fries, TORTILLA CHIPS, and POTATO chips. Popcorn should be air-popped or cooked in small amounts of liquid vegetable oil. Desserts Eat fruit, low-fat yogurt, and fruit ices instead of pastries, cake, and cookies. Sherbet, angel food cake, gelatin dessert, frozen low-fat yogurt, or other frozen products that do not contain saturated fat (pure fruit juice bars, frozen ice pops) are also acceptable.  COOKING METHODS Choose those methods that use little or no fat. They include: Poaching.  Braising.  Steaming.  Grilling.  Baking.  Stir-frying.  Broiling.  Microwaving.  Foods can be cooked in a nonstick pan without added fat, or use a nonfat cooking spray in regular cookware. Limit fried foods and avoid frying in saturated fat. Add moisture to lean meats by using water, broth, cooking wines, and other nonfat or low-fat sauces along with the cooking methods mentioned above. Soups and stews should be chilled after cooking. The fat that forms on top after a few hours in the refrigerator should be skimmed off. When preparing meals, avoid using excess salt. Salt can contribute to raising blood pressure in some people.  EATING AWAY FROM HOME Order entres, potatoes, and vegetables without sauces or butter. When meat exceeds the size of a deck of cards (3 to 4 ounces), the rest can be taken home for another meal. Choose vegetable or fruit salads and ask for low-calorie salad dressings to be served on the side. Use dressings sparingly. Limit high-fat toppings, such  as bacon, crumbled eggs, cheese, sunflower seeds, and olives. Ask for heart-healthy tub margarine instead of butter.   Diverticulosis Diverticulosis is a common condition  that develops when small pouches (diverticula) form in the wall of the colon. The risk of diverticulosis increases with age. It happens more often in people who eat a low-fiber diet. Most individuals with diverticulosis have no symptoms. Those individuals with symptoms usually experience belly (abdominal) pain, constipation, or loose stools (diarrhea).  HOME CARE INSTRUCTIONS  Increase the amount of fiber in your diet as directed by your caregiver or dietician. This may reduce symptoms of diverticulosis.   Drink at least 6 to 8 glasses of water each day to prevent constipation.   Try not to strain when you have a bowel movement.   Avoiding nuts and seeds to prevent complications is still an uncertain benefit.       FOODS HAVING HIGH FIBER CONTENT INCLUDE:  Fruits. Apple, peach, pear, tangerine, raisins, prunes.   Vegetables. Brussels sprouts, asparagus, broccoli, cabbage, carrot, cauliflower, romaine lettuce, spinach, summer squash, tomato, winter squash, zucchini.   Starchy Vegetables. Baked beans, kidney beans, lima beans, split peas, lentils, potatoes (with skin).   Grains. Whole wheat bread, brown rice, bran flake cereal, plain oatmeal, white rice, shredded wheat, bran muffins.    SEEK IMMEDIATE MEDICAL CARE IF:  You develop increasing pain or severe bloating.   You have an oral temperature above 101F.   You develop vomiting or bowel movements that are bloody or black.   Hemorrhoids Hemorrhoids are dilated (enlarged) veins around the rectum. Sometimes clots will form in the veins. This makes them swollen and painful. These are called thrombosed hemorrhoids. Causes of hemorrhoids include:  Constipation.   Straining to have a bowel movement.   HEAVY LIFTING HOME CARE INSTRUCTIONS  Eat a well  balanced diet and drink 6 to 8 glasses of water every day to avoid constipation. You may also use a bulk laxative.   Avoid straining to have bowel movements.   Keep anal area dry and clean.   Do not use a donut shaped pillow or sit on the toilet for long periods. This increases blood pooling and pain.   Move your bowels when your body has the urge; this will require less straining and will decrease pain and pressure.

## 2014-03-13 ENCOUNTER — Encounter (HOSPITAL_COMMUNITY): Payer: Self-pay | Admitting: Gastroenterology

## 2014-03-14 LAB — PANCREATIC ELASTASE, FECAL: PANCREATIC ELASTASE-1, STL: 322 ug/g

## 2014-03-19 NOTE — Progress Notes (Signed)
Quick Note:  Elastase normal. No pancreatic insufficiency.  The results just came back a few days ago. Usually takes a bit for this test.  How is patient doing ______

## 2014-03-19 NOTE — Progress Notes (Signed)
Quick Note:  I called and informed pt. He said he is doing much better. Stools are better, some loose, but having more formed. He will call if he has a problem. ______

## 2014-03-25 ENCOUNTER — Telehealth: Payer: Self-pay | Admitting: Gastroenterology

## 2014-03-25 NOTE — Telephone Encounter (Signed)
PLEASE CALL PT. HIS RANDOM colon bowel biopsies are normal.  HE had a polypoid lesion removed and it was benign.    Continue RESTORA. CALL IN ONE MONTH IF SYMPTOMS ARE NOT IDEALLY CONTROLLED. HE MAY NEED ANOTHER TEST CALLED A HYDROGEN BREATH TEST.   USE DICYCLOMINE 10 MG TABLETS ONE 30 MINUTES BEFORE BREAKFAST AND LUNCH TO PREVENT DIARRHEA. IT MAY CAUSE DROWSINESS, DRY EYES/MOUTH, BLURRY VISION, OR DIFFICULTY URINATING.  FOLLOW A HIGH FIBER/LOW FAT DIET. AVOID ITEMS THAT CAUSE BLOATING.   FOLLOW UP IN SEP 2015.   Next colonoscopy in 5 years.

## 2014-03-26 NOTE — Telephone Encounter (Signed)
Called and informed pt.  

## 2014-03-26 NOTE — Telephone Encounter (Signed)
Reminder in EPIC 

## 2014-07-21 ENCOUNTER — Encounter: Payer: Self-pay | Admitting: Gastroenterology

## 2014-07-21 ENCOUNTER — Ambulatory Visit (INDEPENDENT_AMBULATORY_CARE_PROVIDER_SITE_OTHER): Payer: Managed Care, Other (non HMO) | Admitting: Gastroenterology

## 2014-07-21 VITALS — BP 119/84 | HR 71 | Temp 97.2°F | Ht 69.0 in | Wt 181.6 lb

## 2014-07-21 DIAGNOSIS — R195 Other fecal abnormalities: Secondary | ICD-10-CM

## 2014-07-21 NOTE — Progress Notes (Signed)
cc'ed to pcp °

## 2014-07-21 NOTE — Progress Notes (Signed)
      Primary Care Physician: Deloria Lair, MD  Primary Gastroenterologist:  Barney Drain, MD   Chief Complaint  Patient presents with  . Follow-up    he thinks his BP meds are giving him diarrhea    HPI: Benjamin Chaney is a 68 y.o. male here for follow up chronic diarrhea. Since last OV he had random colon bx which were negative. Stools studies and stool elastase were normal.   Last Thursday, Friday, Saturday didn't take any of medications except for ASA, Advodart and diarrhea went away. Prior to this was having greater than 5 stools daily, sometimes [place] 6 to 7. Varies from day to day. Bentyl helped a little. Started medicine back yesterday morning by 9pm diarrhea started back. No melena, brbpr. No abdominal pain. Appetite so-so. Weight stable. Drinking Ensure because of poor appetite. Imodium didn't help.   Rare dairy. Milk usually doesn't bother him. Sugar, green peppers usually contribute to diarrhea.   Current Outpatient Prescriptions  Medication Sig Dispense Refill  . aspirin 325 MG tablet Take 325 mg by mouth daily.      . benazepril (LOTENSIN) 40 MG tablet Take 20-40 mg by mouth 2 (two) times daily. 1 tablet in am and 1/2 tablet at night      . CARTIA XT 240 MG 24 hr capsule Take 1 capsule by mouth daily.      . Cholecalciferol (VITAMIN D-3) 5000 UNITS TABS Take 5,000 Units by mouth daily.      Marland Kitchen doxazosin (CARDURA) 4 MG tablet Take 4 mg by mouth at bedtime.       . dutasteride (AVODART) 0.5 MG capsule Take 0.5 mg by mouth daily.      . furosemide (LASIX) 40 MG tablet Take 40 mg by mouth daily.      . Red Yeast Rice Extract (RED YEAST RICE PO) Take 1 tablet by mouth once a week.        No current facility-administered medications for this visit.    Allergies as of 07/21/2014  . (No Known Allergies)    ROS:  General: Negative for anorexia, weight loss, fever, chills, fatigue, weakness. ENT: Negative for hoarseness, difficulty swallowing , nasal congestion. CV:  Negative for chest pain, angina, palpitations, dyspnea on exertion, peripheral edema.  Respiratory: Negative for dyspnea at rest, dyspnea on exertion, cough, sputum, wheezing.  GI: See history of present illness. GU:  Negative for dysuria, hematuria, urinary incontinence, urinary frequency, nocturnal urination.  Endo: Negative for unusual weight change.    Physical Examination:   BP 119/84  Pulse 71  Temp(Src) 97.2 F (36.2 C) (Oral)  Ht 5\' 9"  (1.753 m)  Wt 181 lb 9.6 oz (82.373 kg)  BMI 26.81 kg/m2  General: Well-nourished, well-developed in no acute distress.  Eyes: No icterus. Mouth: Oropharyngeal mucosa moist and pink , no lesions erythema or exudate. Lungs: Clear to auscultation bilaterally.  Heart: Regular rate and rhythm, no murmurs rubs or gallops.  Abdomen: Bowel sounds are normal, nontender, nondistended, no hepatosplenomegaly or masses, no abdominal bruits, no rebound or guarding. Large ventral hernia, easily reducible and nontender.   Extremities: No lower extremity edema. No clubbing or deformities. Neuro: Alert and oriented x 4   Skin: Warm and dry, no jaundice.   Psych: Alert and cooperative, normal mood and affect.

## 2014-07-21 NOTE — Patient Instructions (Signed)
1. Please add back your medications one at a time every 2-3 days to see if you can pinpoint which one is causing the diarrhea.  2. Call in 2 weeks and let me know how this went. If you still have diarrhea, then consider hydrogen breath test.

## 2014-07-21 NOTE — Assessment & Plan Note (Signed)
68 year old gentleman with chronic diarrhea for 2 years duration. Recent colonoscopy unremarkable. Random colon biopsies were negative. Stool studies including stool elastase was negative. Weight is been stable. He has a poor appetite. Recently stopped all his medications except for aspirin and advodart and the diarrhea resolved. Now back on all meds and diarrhea has returns. 5+ loose to watery stools daily. Ask him to add back medications one at a time every 2-3 days to try and determine if any causing his symptoms. He will call in 2 weeks with PR. If still having diarrhea, consider HBT for SBBO.

## 2014-09-14 DIAGNOSIS — I4819 Other persistent atrial fibrillation: Secondary | ICD-10-CM | POA: Insufficient documentation

## 2014-09-14 DIAGNOSIS — G4733 Obstructive sleep apnea (adult) (pediatric): Secondary | ICD-10-CM

## 2014-09-14 HISTORY — DX: Obstructive sleep apnea (adult) (pediatric): G47.33

## 2014-09-23 ENCOUNTER — Ambulatory Visit (INDEPENDENT_AMBULATORY_CARE_PROVIDER_SITE_OTHER): Payer: Managed Care, Other (non HMO) | Admitting: Gastroenterology

## 2014-09-23 ENCOUNTER — Encounter: Payer: Self-pay | Admitting: Gastroenterology

## 2014-09-23 ENCOUNTER — Other Ambulatory Visit: Payer: Self-pay | Admitting: Gastroenterology

## 2014-09-23 ENCOUNTER — Other Ambulatory Visit: Payer: Self-pay

## 2014-09-23 VITALS — BP 135/90 | HR 80 | Temp 97.1°F | Ht 69.0 in | Wt 181.0 lb

## 2014-09-23 DIAGNOSIS — R197 Diarrhea, unspecified: Secondary | ICD-10-CM

## 2014-09-23 DIAGNOSIS — R195 Other fecal abnormalities: Secondary | ICD-10-CM

## 2014-09-23 LAB — COMPREHENSIVE METABOLIC PANEL
ALBUMIN: 3.7 g/dL (ref 3.5–5.2)
ALK PHOS: 70 U/L (ref 39–117)
ALT: 17 U/L (ref 0–53)
AST: 22 U/L (ref 0–37)
BUN: 20 mg/dL (ref 6–23)
CO2: 25 mEq/L (ref 19–32)
Calcium: 8.4 mg/dL (ref 8.4–10.5)
Chloride: 110 mEq/L (ref 96–112)
Creat: 1.66 mg/dL — ABNORMAL HIGH (ref 0.50–1.35)
GLUCOSE: 85 mg/dL (ref 70–99)
POTASSIUM: 4.4 meq/L (ref 3.5–5.3)
SODIUM: 144 meq/L (ref 135–145)
TOTAL PROTEIN: 5.4 g/dL — AB (ref 6.0–8.3)
Total Bilirubin: 0.3 mg/dL (ref 0.2–1.2)

## 2014-09-23 LAB — CBC WITH DIFFERENTIAL/PLATELET
Basophils Absolute: 0 10*3/uL (ref 0.0–0.1)
Basophils Relative: 0 % (ref 0–1)
Eosinophils Absolute: 0.1 10*3/uL (ref 0.0–0.7)
Eosinophils Relative: 2 % (ref 0–5)
HEMATOCRIT: 38.6 % — AB (ref 39.0–52.0)
HEMOGLOBIN: 11.6 g/dL — AB (ref 13.0–17.0)
LYMPHS PCT: 54 % — AB (ref 12–46)
Lymphs Abs: 2.7 10*3/uL (ref 0.7–4.0)
MCH: 23.5 pg — ABNORMAL LOW (ref 26.0–34.0)
MCHC: 30.1 g/dL (ref 30.0–36.0)
MCV: 78.3 fL (ref 78.0–100.0)
MONOS PCT: 7 % (ref 3–12)
MPV: 9.6 fL (ref 9.4–12.4)
Monocytes Absolute: 0.4 10*3/uL (ref 0.1–1.0)
NEUTROS ABS: 1.9 10*3/uL (ref 1.7–7.7)
NEUTROS PCT: 37 % — AB (ref 43–77)
Platelets: 204 10*3/uL (ref 150–400)
RBC: 4.93 MIL/uL (ref 4.22–5.81)
RDW: 14.8 % (ref 11.5–15.5)
WBC: 5 10*3/uL (ref 4.0–10.5)

## 2014-09-23 LAB — TSH: TSH: 1.513 u[IU]/mL (ref 0.350–4.500)

## 2014-09-23 MED ORDER — DICYCLOMINE HCL 10 MG PO CAPS: ORAL_CAPSULE | ORAL | Status: DC

## 2014-09-23 NOTE — Progress Notes (Signed)
Primary Care Physician: Deloria Lair, MD  Primary Gastroenterologist:  Barney Drain, MD   Chief Complaint  Patient presents with  . Diarrhea    HPI: Benjamin Chaney is a 68 y.o. male here for follow up of diarrhea. Seen back in 07/2014. Previously had TCS 02/2014 which showed normal terminal ileum, mild diverticulosis, normal anastomosis (previous left hemicolectomy for colon cancer 1997), one small rectal polyp. Random colon biopsies were negative. Rectal polyp showed no adenomatous change. He is due for a another colonoscopy in May 2020.  When I saw him back in September he felt like his blood pressure medication was causing his diarrhea. He had held it for several days and the diarrhea resolved. Actually stopped all his medications except for Advodart and aspirin. When he resumed his medications the diarrhea returned. Felt like [place] didn't help. He actually told be the bentyl didn't help but today he states he never took it, only the [place].  Still having diarrhea several times per week but doesn't last all day. 50% improved from 02/2014. Notes Nyquil/Dayquil will stop the diarrhea, BM will be more formed and works for 3 days.  No watery stools now. Bristol 4-6. No melena, brbpr. No nocturnal. No vomiting. Never has constipation. Weight stable since 02/2014.   Current Outpatient Prescriptions  Medication Sig Dispense Refill  . aspirin 325 MG tablet Take 325 mg by mouth daily.    . benazepril (LOTENSIN) 40 MG tablet Take 20-40 mg by mouth 2 (two) times daily. 1 tablet in am and 1/2 tablet at night    . CARTIA XT 240 MG 24 hr capsule Take 1 capsule by mouth daily.    . Cholecalciferol (VITAMIN D-3) 5000 UNITS TABS Take 5,000 Units by mouth daily.    Marland Kitchen doxazosin (CARDURA) 4 MG tablet Take 4 mg by mouth at bedtime.     . dutasteride (AVODART) 0.5 MG capsule Take 0.5 mg by mouth daily.    . furosemide (LASIX) 40 MG tablet Take 40 mg by mouth daily.    . Red Yeast Rice Extract (RED  YEAST RICE PO) Take 1 tablet by mouth once a week.      No current facility-administered medications for this visit.    Allergies as of 09/23/2014  . (No Known Allergies)    ROS:  General: Negative for anorexia, weight loss, fever, chills, fatigue, weakness. ENT: Negative for hoarseness, difficulty swallowing , nasal congestion. CV: Negative for chest pain, angina, palpitations, dyspnea on exertion, peripheral edema.  Respiratory: Negative for dyspnea at rest, dyspnea on exertion, cough, sputum, wheezing.  GI: See history of present illness. GU:  Negative for dysuria, hematuria, urinary incontinence, urinary frequency, nocturnal urination.  Endo: Negative for unusual weight change.    Physical Examination:   BP 135/90 mmHg  Pulse 80  Temp(Src) 97.1 F (36.2 C) (Oral)  Ht 5\' 9"  (1.753 m)  Wt 181 lb (82.101 kg)  BMI 26.72 kg/m2  General: Well-nourished, well-developed in no acute distress.  Eyes: No icterus. Mouth: Oropharyngeal mucosa moist and pink , no lesions erythema or exudate. Lungs: Clear to auscultation bilaterally.  Heart: Regular rate and rhythm, no murmurs rubs or gallops.  Abdomen: Bowel sounds are normal, nontender, nondistended, no hepatosplenomegaly or masses, no abdominal bruits, no rebound or guarding.  Large ventral hernia easily reducible.  Extremities: No lower extremity edema. No clubbing or deformities. Neuro: Alert and oriented x 4   Skin: Warm and dry, no jaundice.   Psych: Alert and  cooperative, normal mood and affect.   Imaging Studies: No results found.

## 2014-09-23 NOTE — Patient Instructions (Signed)
1. Take dicyclomine 1-2 before breakfast and lunch as needed for diarrhea. We can adjust dose if needed.  2. Hydrogen breath test to further evaluate your diarrhea.  3. Office visit with [name] in 3 months.

## 2014-09-23 NOTE — Assessment & Plan Note (Signed)
Persistent intermittent diarrhea. Complains of bloating/gas. ?stasis involving large ventral hernia causing small bowel bacterial overgrowth. Check HBT. Trial of Bentyl, patient now reports he never tried it before. If HBT, negative, then to discuss with [name], ?consider CT imaging as next step.

## 2014-09-23 NOTE — Progress Notes (Signed)
cc'ed to pcp °

## 2014-09-24 LAB — FERRITIN: Ferritin: 155 ng/mL (ref 22–322)

## 2014-09-24 LAB — IRON AND TIBC
%SAT: 25 % (ref 20–55)
Iron: 60 ug/dL (ref 42–165)
TIBC: 241 ug/dL (ref 215–435)
UIBC: 181 ug/dL (ref 125–400)

## 2014-09-24 LAB — TISSUE TRANSGLUTAMINASE, IGA

## 2014-09-24 LAB — IGA: IgA: 125 mg/dL (ref 68–379)

## 2014-09-24 NOTE — Progress Notes (Signed)
Quick Note:  Please let patient know his Hgb is slightly low. His creatinine is slightly elevated.  I don't have any baselines for comparison.  We need to add on an iron/tibc ferritin. Have patient do an ifobt. He needs to see PCP next week for abnormal creatinine. Send copy of labs. ______

## 2014-10-01 ENCOUNTER — Ambulatory Visit (HOSPITAL_COMMUNITY)
Admission: RE | Admit: 2014-10-01 | Discharge: 2014-10-01 | Disposition: A | Discharge status: Home / Self Care | Payer: Managed Care, Other (non HMO) | Source: Ambulatory Visit | Attending: Gastroenterology | Admitting: Gastroenterology

## 2014-10-01 ENCOUNTER — Encounter (HOSPITAL_COMMUNITY)
Admission: RE | Disposition: A | Discharge status: Home / Self Care | Payer: Self-pay | Source: Ambulatory Visit | Attending: Gastroenterology

## 2014-10-01 DIAGNOSIS — R197 Diarrhea, unspecified: Secondary | ICD-10-CM | POA: Insufficient documentation

## 2014-10-01 HISTORY — PX: BACTERIAL OVERGROWTH TEST: SHX5739

## 2014-10-01 SURGERY — BREATH TEST, FOR INTESTINAL BACTERIAL OVERGROWTH

## 2014-10-01 MED ORDER — LACTULOSE 10 GM/15ML PO SOLN
ORAL | Status: AC
  Filled 2014-10-01: qty 60

## 2014-10-01 MED ORDER — SODIUM CHLORIDE 0.9 % IV SOLN: INTRAVENOUS | Status: DC

## 2014-10-01 NOTE — Discharge Instructions (Signed)
No beans, bran or high fiber cereal the day before the procedure? no NPO except for water 12 hours before procedure? yes No smoking, sleeping or vigorous exercising for at least 30 before procedure? no Recent antibiotic use and/or diarrhea? no   If yes, physician notified.  Time Baseline 15 mins 30 mins 45 mins 60 mins 75 mins 90 mins 105 mins 120 mins 135 mins 150 mins 165 mins 180 mins  H2-ppm

## 2014-10-01 NOTE — Progress Notes (Signed)
No beans, bran or high fiber cereal the day before the procedure? no NPO except for water 12 hours before procedure? yes No smoking, sleeping or vigorous exercising for at least 30 before procedure? no Recent antibiotic use and/or diarrhea? no   If yes, physician notified.  Time Baseline 15 mins 30 mins 45 mins 60 mins 75 mins 90 mins 105 mins 120 mins 135 mins 150 mins 165 mins 180 mins  H2-ppm 0 4 18 21 28 20 21  29  25 23 21  23   28

## 2014-10-02 ENCOUNTER — Encounter (HOSPITAL_COMMUNITY): Payer: Self-pay | Admitting: Gastroenterology

## 2014-10-03 NOTE — Progress Notes (Signed)
Quick Note:  No IDA. ?mild anemia of chronic disease. TCS up to date.  Please remind patient to complete ifobt. Please get copy of latest Hgb and Creatinine from PCP for comparison. ______

## 2014-10-06 NOTE — Progress Notes (Signed)
Quick Note:  LM for a return call. ______ 

## 2014-10-06 NOTE — Progress Notes (Signed)
Quick Note:  Pt said he does not have the iFOBT kit and I am leaving one at the front for him. Routing to Manuela Schwartz to get copies of lab from PCP. ______

## 2014-10-07 ENCOUNTER — Ambulatory Visit (INDEPENDENT_AMBULATORY_CARE_PROVIDER_SITE_OTHER): Payer: Managed Care, Other (non HMO)

## 2014-10-07 DIAGNOSIS — R197 Diarrhea, unspecified: Secondary | ICD-10-CM

## 2014-10-07 LAB — IFOBT (OCCULT BLOOD): IFOBT: NEGATIVE

## 2014-10-07 NOTE — Addendum Note (Signed)
Addended by: Martin[place], Qunicy Higinbotham M on: 10/07/2014 04:16 PM   Modules accepted: Level of Service

## 2014-10-07 NOTE — Progress Notes (Signed)
Pt dropped off IFOBT test . Test result was negative.

## 2014-10-07 NOTE — Addendum Note (Signed)
Addended by: Martin[place], Karmine Kauer M on: 10/07/2014 04:17 PM   Modules accepted: Level of Service

## 2014-10-08 NOTE — Progress Notes (Signed)
Quick Note:  ifobt negative. Did he have the small bowel bacterial overgrowth test in endo on Friday as schedule. ______

## 2014-10-09 NOTE — Progress Notes (Signed)
Quick Note:  Pt is aware of the iFOBT result. He said he did have the small bowel bacterial overgrowth test done on Friday. ______

## 2014-10-15 NOTE — Progress Notes (Signed)
Quick Note:  OK. I will see if [facility] read it yet. He needs CBC in 8 weeks. ______

## 2014-10-20 ENCOUNTER — Other Ambulatory Visit: Payer: Self-pay

## 2014-10-20 DIAGNOSIS — D649 Anemia, unspecified: Secondary | ICD-10-CM

## 2014-10-20 NOTE — Progress Notes (Signed)
Quick Note:  PT's wife aware that we will mail lab order to him to repeat CBC in 8 weeks. Lab order on file. ______

## 2014-10-30 ENCOUNTER — Telehealth: Payer: Self-pay | Admitting: Gastroenterology

## 2014-10-30 DIAGNOSIS — R197 Diarrhea, unspecified: Secondary | ICD-10-CM | POA: Insufficient documentation

## 2014-10-30 MED ORDER — METRONIDAZOLE 500 MG PO TABS: 500.0000 mg | ORAL_TABLET | Freq: Two times a day (BID) | ORAL | Status: DC

## 2014-10-30 MED ORDER — CIPROFLOXACIN HCL 500 MG PO TABS: ORAL_TABLET | ORAL | Status: DC

## 2014-10-30 NOTE — Telephone Encounter (Signed)
PTs wife aware.

## 2014-10-30 NOTE — Procedures (Addendum)
  PRE-OPERATIVE DIAGNOSIS:  DIARRHEA, EVALUATE FOR SBBO  POST-OPERATIVE DIAGNOSIS:  SMALL INTESTINAL BACTERIAL OVERGROWTH  PROCEDURE:  Procedure(s): HYDROGEN BREATH TEST  SURGEON:  Surgeon(s): Dorothyann Peng, MD  MEDICATIONS USED:  LACTULOSE  FINDINGS: BREATHALYZER- O MIN(0 PPM), FIRST PEAK (28 PPM, 60 MINS), TROUGH (21 PPM 90 MINS), SECOND PEAK (29 PPM, 105 MINS)  DIAGNOSIS: SMALL BOWEL BACTERIAL OVERGROWTH.   PLAN OF CARE:  1. CIP/FLAG for 5 days. 2. Follow up in [facility] 2016

## 2014-10-30 NOTE — Telephone Encounter (Signed)
PLEASE CALL PT.  HIS HYDROGEN BREATH TEST SHOWS SMALL BOWEL BACTERIAL OVERGROWTH. HE SHOULD HAVE LESS DIARRHEA AFTER TAKING ANTIBIOTICS. CIPRO 500 MF & FLAGYL 500 MG BID for 5 days.  HE MAY CONTINUE BENTYL. STOP BENTYL IT IF HE DEVELOPS CONSTIPATION. CONTINUE PROBIOTIC. OPV IN [facility] 2016 E 30 VISIT WITH [facility] DIARRHEA.  CIPRO CAN CAUSE HEEL PAIN AND FLAGYL WILL MAY  CAUSE VOMITING OR NAUSEA. PLEASE CALL WITH QUESTIONS OR CONCERNS. Marland Kitchen

## 2014-10-30 NOTE — Telephone Encounter (Signed)
ON RECALL  °

## 2014-11-17 ENCOUNTER — Telehealth: Payer: Self-pay | Admitting: Gastroenterology

## 2014-11-17 NOTE — Telephone Encounter (Signed)
PATIENT DIARRHEA STARTED UP AGAIN Saturday.  MEDICATION HE TOOK BEFORE WORKED, ONLY HAD TO TAKE IT FOR FIVE DAYS.  [place] OR CIPROFLOXACN    PLEASE ADVISE  [phone]   CELL [phone]

## 2014-11-17 NOTE — Telephone Encounter (Signed)
    Sydell Axon Southard at 11/17/2014 3:26 PM     Status: Signed       Expand All Collapse All   PATIENT DIARRHEA STARTED UP AGAIN Saturday. MEDICATION HE TOOK BEFORE WORKED, ONLY HAD TO TAKE IT FOR FIVE DAYS. [place] OR CIPROFLOXACN PLEASE ADVISE [phone] CELL [phone]

## 2014-11-17 NOTE — Telephone Encounter (Signed)
See separate note dated 11/17/2014.

## 2014-11-17 NOTE — Telephone Encounter (Signed)
I called pt. He said he had 2 watery stools and one soft/loose stool Sat.  Sunday and today he has had 3 more, some watery and some soft/loose. Please advise!

## 2014-11-18 ENCOUNTER — Other Ambulatory Visit: Payer: Self-pay

## 2014-11-18 DIAGNOSIS — R197 Diarrhea, unspecified: Secondary | ICD-10-CM

## 2014-11-18 NOTE — Telephone Encounter (Signed)
I called pt. He is taking the Bentyl. He will come by and pick up the stool container and order at front desk to do the C-Diff pcr.

## 2014-11-18 NOTE — Telephone Encounter (Signed)
Make sure he is taking Bentyl. Let's check a Cdiff PCR simply because he was recently on abx.

## 2014-11-20 LAB — CLOSTRIDIUM DIFFICILE BY PCR: CDIFFPCR: NOT DETECTED

## 2014-11-28 ENCOUNTER — Telehealth: Payer: Self-pay | Admitting: Gastroenterology

## 2014-11-28 MED ORDER — DICYCLOMINE HCL 10 MG PO CAPS: ORAL_CAPSULE | ORAL | Status: DC

## 2014-11-28 NOTE — Telephone Encounter (Signed)
Completed.

## 2014-11-28 NOTE — Telephone Encounter (Signed)
Routing to refill box  

## 2014-11-28 NOTE — Telephone Encounter (Signed)
Pt said that [facility] told him to call us because he is out of his dicyclomine.  I told him the other day to have his pharmacy fax [place] a refill request, but they are telling him different.

## 2014-12-01 ENCOUNTER — Encounter: Payer: Self-pay | Admitting: Gastroenterology

## 2014-12-01 ENCOUNTER — Other Ambulatory Visit: Payer: Self-pay

## 2014-12-02 ENCOUNTER — Encounter: Payer: Self-pay | Admitting: Gastroenterology

## 2014-12-02 NOTE — Progress Notes (Signed)
Quick Note:  PT returned call and was informed. He said he takes one Bentyl before all 3 meals and it has helped a lot.  He still has some flares that it doesn't help that much. He is aware he is on recall for Feb and OK to schedule. ______

## 2014-12-02 NOTE — Progress Notes (Signed)
Quick Note:  Cdiff negative.  How is he doing with Bentyl? ______

## 2014-12-02 NOTE — Progress Notes (Signed)
Quick Note:  LM for pt to call. ______ 

## 2014-12-02 NOTE — Progress Notes (Signed)
APPOINTMENT MADE AND LETTER SENT °

## 2014-12-25 ENCOUNTER — Ambulatory Visit (INDEPENDENT_AMBULATORY_CARE_PROVIDER_SITE_OTHER): Payer: Managed Care, Other (non HMO) | Admitting: Gastroenterology

## 2014-12-25 ENCOUNTER — Encounter: Payer: Self-pay | Admitting: Gastroenterology

## 2014-12-25 VITALS — BP 150/98 | HR 72 | Temp 97.1°F | Ht 69.0 in | Wt 185.2 lb

## 2014-12-25 DIAGNOSIS — K6389 Other specified diseases of intestine: Secondary | ICD-10-CM | POA: Insufficient documentation

## 2014-12-25 MED ORDER — CIPROFLOXACIN HCL 500 MG PO TABS: 500.0000 mg | ORAL_TABLET | Freq: Two times a day (BID) | ORAL | Status: DC

## 2014-12-25 MED ORDER — METRONIDAZOLE 500 MG PO TABS: 500.0000 mg | ORAL_TABLET | Freq: Two times a day (BID) | ORAL | Status: DC

## 2014-12-25 NOTE — Patient Instructions (Signed)
1. Take Cipro and Flagyl twice daily for five days for small bowel bacterial overgrowth. Prescription sent to your pharmacy. 2. You may continue Bentyl but after taking cipro/flagyl please try to taper off the bentyl to see if you still need it. 3. Call with any lack of improvement or worsening symptoms.  4. Return to see [name] in four months.

## 2014-12-25 NOTE — Progress Notes (Signed)
      Primary Care Physician: Deloria Lair, MD  Primary Gastroenterologist:  Barney Drain, MD   Chief Complaint  Patient presents with  . Diarrhea    HPI: Benjamin Chaney is a 69 y.o. male here for follow-up of diarrhea. He has a history of small bowel bacterial overgrowth. He was last seen in November. Treated previously with Cipro and Flagyl for 5 days with good results. Has also utilized Bentyl. Last colonoscopy May 2015 showed normal terminal ileum, mild diverticulosis, normal anastomosis (previous left hemicolectomy for colon cancer 1997), one small rectal polyp with no adenomatous change. Random colon biopsies were negative. He is due for his next colonoscopy in May 2020. He has a large ventral hernia. He called in complaints of diarrhea in January. C. difficile PCR was negative. Diarrhea was being controlled with Bentyl 3 times daily before meals.  BM 1-2 per day, formed if takes bentyl TID. If misses the bentyl then the diarrhea can "get bad". Some gas/bloating. No abdominal pain. No melena, brbpr. No heartburn, vomiting. No fever.   Current Outpatient Prescriptions  Medication Sig Dispense Refill  . aspirin 325 MG tablet Take 325 mg by mouth daily.    . benazepril (LOTENSIN) 40 MG tablet Take 20-40 mg by mouth 2 (two) times daily. 1 tablet in am and 1/2 tablet at night    . CARTIA XT 240 MG 24 hr capsule Take 1 capsule by mouth daily.    Marland Kitchen dicyclomine (BENTYL) 10 MG capsule Take 1-2 before breakfast and before lunch for diarrhea as needed. 90 capsule 1  . doxazosin (CARDURA) 4 MG tablet Take 4 mg by mouth at bedtime.     . dutasteride (AVODART) 0.5 MG capsule Take 0.5 mg by mouth daily.    . furosemide (LASIX) 40 MG tablet Take 40 mg by mouth daily.    . Red Yeast Rice Extract (RED YEAST RICE PO) Take 1 tablet by mouth once a week.      No current facility-administered medications for this visit.    Allergies as of 12/25/2014  . (No Known Allergies)    ROS:  General:  Negative for anorexia, weight loss, fever, chills, fatigue, weakness. ENT: Negative for hoarseness, difficulty swallowing , nasal congestion. CV: Negative for chest pain, angina, palpitations, dyspnea on exertion, peripheral edema.  Respiratory: Negative for dyspnea at rest, dyspnea on exertion, cough, sputum, wheezing.  GI: See history of present illness. GU:  Negative for dysuria, hematuria, urinary incontinence, urinary frequency, nocturnal urination.  Endo: Negative for unusual weight change.    Physical Examination:   BP 150/98 mmHg  Pulse 72  Temp(Src) 97.1 F (36.2 C)  Ht 5\' 9"  (1.753 m)  Wt 185 lb 3.2 oz (84.006 kg)  BMI 27.34 kg/m2  General: Well-nourished, well-developed in no acute distress.  Eyes: No icterus. Mouth: Oropharyngeal mucosa moist and pink , no lesions erythema or exudate. Lungs: Clear to auscultation bilaterally.  Heart: Regular rate and rhythm, no murmurs rubs or gallops.  Abdomen: Bowel sounds are normal, nontender, nondistended, no hepatosplenomegaly or masses, no abdominal bruits, no rebound or guarding.  Large ventral hernia easily reducible.  Extremities: No lower extremity edema. No clubbing or deformities. Neuro: Alert and oriented x 4   Skin: Warm and dry, no jaundice.   Psych: Alert and cooperative, normal mood and affect.

## 2014-12-25 NOTE — Assessment & Plan Note (Signed)
Suspect he would benefit from another round of cipro/flagyl for five days. Then try to taper down on bentyl if possible. Call with persistent or worsening symptoms. Return to see [name] in four months.

## 2014-12-30 NOTE — Progress Notes (Signed)
CC'ED TO PCP 

## 2015-01-05 ENCOUNTER — Telehealth: Payer: Self-pay | Admitting: Gastroenterology

## 2015-01-05 NOTE — Telephone Encounter (Signed)
PATIENT CALLED TO LET us KNOW THAT 3 DAYS AFTER ANTIBIOTICS HE STARTED HAVING DIARRHEA AGAIN, HAS TO TAKE DICYCLOMINE TO STOP DIARRHEA   PLEASE ADVISE.   TAKES ON IN THE MORNING AND ONE IN THE AFTERNOON.  PT PHONE NUMBER IS [phone]

## 2015-01-05 NOTE — Telephone Encounter (Signed)
REVIEWED-NO ADDITIONAL RECOMMENDATIONS. 

## 2015-01-05 NOTE — Progress Notes (Signed)
REVIEWED-NO ADDITIONAL RECOMMENDATIONS. 

## 2015-01-05 NOTE — Telephone Encounter (Signed)
Pt said that once he completed the antibiotic he  Went a few days and then started having diarrhea again. He is taking the Dicyclomine bid and it is controlled. He just wanted to let [name] know this.

## 2015-03-02 ENCOUNTER — Other Ambulatory Visit: Payer: Self-pay

## 2015-03-03 MED ORDER — DICYCLOMINE HCL 10 MG PO CAPS: ORAL_CAPSULE | ORAL | Status: DC

## 2015-03-12 ENCOUNTER — Encounter: Payer: Self-pay | Admitting: Gastroenterology

## 2015-04-09 ENCOUNTER — Ambulatory Visit (INDEPENDENT_AMBULATORY_CARE_PROVIDER_SITE_OTHER): Payer: Managed Care, Other (non HMO) | Admitting: Gastroenterology

## 2015-04-09 ENCOUNTER — Encounter: Payer: Self-pay | Admitting: Gastroenterology

## 2015-04-09 VITALS — BP 120/73 | HR 54 | Temp 97.3°F | Ht 69.0 in | Wt 183.0 lb

## 2015-04-09 DIAGNOSIS — K6389 Other specified diseases of intestine: Secondary | ICD-10-CM | POA: Diagnosis not present

## 2015-04-09 DIAGNOSIS — R197 Diarrhea, unspecified: Secondary | ICD-10-CM | POA: Diagnosis not present

## 2015-04-09 MED ORDER — CIPROFLOXACIN HCL 500 MG PO TABS: 500.0000 mg | ORAL_TABLET | Freq: Two times a day (BID) | ORAL | Status: DC

## 2015-04-09 MED ORDER — METRONIDAZOLE 500 MG PO TABS: 500.0000 mg | ORAL_TABLET | Freq: Two times a day (BID) | ORAL | Status: DC

## 2015-04-09 MED ORDER — DICYCLOMINE HCL 10 MG PO CAPS: ORAL_CAPSULE | ORAL | Status: DC

## 2015-04-09 NOTE — Patient Instructions (Signed)
1. Cipro and Flagyl twice daily for 5 days. Prescription sent to your pharmacy. 2. New Bentyl prescription sent to your pharmacy. 3. We will request recent labs from Dr. Scotty Court for review. I will discuss your case with [name]. If any further workup needed we will let you know. 4. Return to see [name] in 4 months.

## 2015-04-09 NOTE — Progress Notes (Signed)
Primary Care Physician: Deloria Lair, MD  Primary Gastroenterologist:  Barney Drain, MD   Chief Complaint  Patient presents with  . Follow-up    HPI: Benjamin Chaney is a 69 y.o. male here for follow-up. Last seen in February 2016. We have been seeing him persistently over the last one year for diarrhea. Diarrhea for at least 2 years. Significant history of colon cancer status post left hemicolectomy in 1997. Most recent evaluation showed small bowel bacterial overgrowth on hydrogen breath test last fall. Treated previously with Cipro and Flagyl with good results. Last colonoscopy May 2015, normal terminal ileum, mild diverticulosis, normal anastomosis (previous left hemicolectomy for colon cancer 1997), one small rectal polyp and no adenomatous change. Random colon biopsies were negative. Next colonoscopy due May 2020. He has a history of large ventral hernia.  He has been treated now with 2 courses of Cipro and Flagyl with good results for the past 6 months but symptoms recur. Relief lasted for about one month. Diarrhea resolved after 2 days of therapy. Slowly symptoms came back. He has been using Bentyl about twice daily but lost his pills Sunday when he went to a restaurant.  Bentyl really helps the diarrhea. Bristol 5-7. No longer watery. BM 2 times daily about twice. Bad day maybe 3-4. Mostly good days. Will have a bout of diarrhea couple times per week. Not much abdominal pain, intermittent only. No melena, brbpr. No heartburn or indigestion. No weight loss.   No recent abdominal imaging.  Current Outpatient Prescriptions  Medication Sig Dispense Refill  . aspirin 325 MG tablet Take 325 mg by mouth daily.    . benazepril (LOTENSIN) 40 MG tablet Take 20-40 mg by mouth 2 (two) times daily. 1 tablet in am and 1/2 tablet at night    . CARTIA XT 240 MG 24 hr capsule Take 1 capsule by mouth daily.    Marland Kitchen dicyclomine (BENTYL) 10 MG capsule Take 1-2 before breakfast and before lunch for  diarrhea as needed. 90 capsule 1  . doxazosin (CARDURA) 4 MG tablet Take 4 mg by mouth at bedtime.     . dutasteride (AVODART) 0.5 MG capsule Take 0.5 mg by mouth daily.    . furosemide (LASIX) 40 MG tablet Take 40 mg by mouth daily.    . Red Yeast Rice Extract (RED YEAST RICE PO) Take 1 tablet by mouth once a week.      No current facility-administered medications for this visit.    Allergies as of 04/09/2015  . (No Known Allergies)   Past Surgical History  Procedure Laterality Date  . Colon surgery    . Back surgery    . Colonoscopy  2004    Dr. Lindalou Hose: normal  . Hernia repair      in his 49s  . Colonoscopy N/A 03/11/2014    [facility]: Normal mucosa in the terminal ileum/Mild diverticulosis in the ascending colon/ONE SMALL RECTAL POLYP REMOVED/Small internal hemorrhoids/NORMAL ANASTOMOSIS. benign random bx and benign polyp. next TCS 03/2019  . Bacterial overgrowth test N/A 10/01/2014    Procedure: BACTERIAL OVERGROWTH TEST;  Surgeon: Danie Binder, MD;  Location: [facility];  Service: Endoscopy;  Laterality: N/A;  7:30am    ROS:  General: Negative for anorexia, weight loss, fever, chills, fatigue, weakness. ENT: Negative for hoarseness, difficulty swallowing , nasal congestion. CV: Negative for chest pain, angina, palpitations, dyspnea on exertion, peripheral edema.  Respiratory: Negative for dyspnea at rest, dyspnea on exertion, cough, sputum,  wheezing.  GI: See history of present illness. GU:  Negative for dysuria, hematuria, urinary incontinence, urinary frequency, nocturnal urination.  Endo: Negative for unusual weight change.    Physical Examination:   BP 120/73 mmHg  Pulse 54  Temp(Src) 97.3 F (36.3 C)  Ht 5\' 9"  (1.753 m)  Wt 183 lb (83.008 kg)  BMI 27.01 kg/m2  General: Well-nourished, well-developed in no acute distress.  Eyes: No icterus. Mouth: Oropharyngeal mucosa moist and pink , no lesions erythema or exudate. Lungs: Clear to auscultation bilaterally.    Heart: Regular rate and rhythm, no murmurs rubs or gallops.  Abdomen: Bowel sounds are normal, nontender, nondistended, no hepatosplenomegaly or masses, no abdominal bruits or hernia , no rebound or guarding.   Extremities: No lower extremity edema. No clubbing or deformities. Neuro: Alert and oriented x 4   Skin: Warm and dry, no jaundice.   Psych: Alert and cooperative, normal mood and affect.  Labs:  Lab Results  Component Value Date   WBC 5.0 09/23/2014   HGB 11.6* 09/23/2014   HCT 38.6* 09/23/2014   MCV 78.3 09/23/2014   PLT 204 09/23/2014    Imaging Studies: No results found.

## 2015-04-09 NOTE — Assessment & Plan Note (Signed)
69 year old gentleman with 2 year history of diarrhea, workup last year included colonoscopy which was unremarkable as far as etiology for his diarrhea. Ultimately had a hydrogen breath test which is positive for small bowel bacterial overgrowth. Treated twice with antibiotics in the past 6 months. Good results, but short-lived. Bentyl helps with diarrhea. Large ventral hernia may be contributing to bacterial overgrowth.  Repeat Cipro and Flagyl for 5 days. Continue Bentyl as before. Will discuss further with [name].? Consider CT imaging to rule out other etiology for diarrhea.  I have requested copies of his recent labs from PCP for review. He had mild anemia last year with no evidence of iron deficiency. Failed to have follow-up labs with [place]. Also noted to have elevated creatinine,? Baseline. May have anemia of chronic disease.

## 2015-04-10 NOTE — Progress Notes (Signed)
CC'ED TO PCP 

## 2015-04-13 ENCOUNTER — Ambulatory Visit: Payer: Managed Care, Other (non HMO) | Admitting: Gastroenterology

## 2015-05-28 NOTE — Progress Notes (Signed)
Received copy of labs from PCP from back in March 2016. Mild anemia is stable. Chronic renal insufficiency confirmed.  [name], patient with history of small bowel bacterial overgrowth. Has required repeated treatment with anabiotic therapy which helps but is short-lived. He has a large ventral hernia. Should we consider CT imaging or any other recommendations.

## 2015-05-29 NOTE — Progress Notes (Signed)
REVIEWED-NO ADDITIONAL RECOMMENDATIONS. 

## 2015-05-29 NOTE — Progress Notes (Addendum)
REVIEWED NOTES FROM SEP 2015 TO PRESENT. TCS UTD. COURSE OF CIP/FLAGYL X5DAYS AND BENTYL CONTROL HIS DIARRHEA. GIVE PT COURSE OF CIP[/FLAG FOR 5 DAYS ONCE A MO. CONTINUE PROBIOTIC DAILY. BENTYL 2 OR 3 TIMES A DAY TO CONTROL SYMPTOMS. OPV IN 3 MOS E30 DIARRHEA/SIBO.

## 2015-06-09 MED ORDER — METRONIDAZOLE 500 MG PO TABS: 500.0000 mg | ORAL_TABLET | Freq: Two times a day (BID) | ORAL | Status: DC

## 2015-06-09 MED ORDER — CIPROFLOXACIN HCL 500 MG PO TABS: 500.0000 mg | ORAL_TABLET | Freq: Two times a day (BID) | ORAL | Status: DC

## 2015-06-09 NOTE — Progress Notes (Signed)
Please inform patient of [facility] instructions.   Cipro 500mg  BID for 5 days, monthly. Flagyl 500mg  BID for 5 days, monthly. Continue Bentyl 2-3 times daily as before.  Continue Probiotic.  OV with [facility]F in 3 months E30 (diarrhea/SIBO).  RX done with 3 refills.

## 2015-06-09 NOTE — Addendum Note (Signed)
Addended by: Mahala Menghini on: 06/09/2015 01:35 PM   Modules accepted: Orders

## 2015-06-17 NOTE — Progress Notes (Signed)
Pt is aware and has started on the Cipro and Flagyl.

## 2015-07-13 ENCOUNTER — Encounter: Payer: Self-pay | Admitting: Gastroenterology

## 2015-08-11 ENCOUNTER — Ambulatory Visit (INDEPENDENT_AMBULATORY_CARE_PROVIDER_SITE_OTHER): Payer: Managed Care, Other (non HMO) | Admitting: Gastroenterology

## 2015-08-11 ENCOUNTER — Encounter: Payer: Self-pay | Admitting: Gastroenterology

## 2015-08-11 VITALS — BP 148/102 | HR 86 | Temp 97.7°F | Ht 69.0 in | Wt 183.2 lb

## 2015-08-11 DIAGNOSIS — K6389 Other specified diseases of intestine: Secondary | ICD-10-CM | POA: Diagnosis not present

## 2015-08-11 NOTE — Patient Instructions (Addendum)
PLEASE CALL WITH QUESTIONS OR CONCERNS RE: ABDOMINAL PAIN OR DIARRHEA.  DRINK WATER TO KEEP YOUR URINE LIGHT YELLOW.  FOLLOW A HIGH FIBER DIET. AVOID ITEMS THAT CAUSE BLOATING & GAS. SEE INFO BELOW.  FOLLOW UP IN 6 MOS.   High-Fiber Diet A high-fiber diet changes your normal diet to include more whole grains, legumes, fruits, and vegetables. Changes in the diet involve replacing refined carbohydrates with unrefined foods. The calorie level of the diet is essentially unchanged. The Dietary Reference Intake (recommended amount) for adult males is 38 grams per day. For adult females, it is 25 grams per day. Pregnant and lactating women should consume 28 grams of fiber per day. Fiber is the intact part of a plant that is not broken down during digestion. Functional fiber is fiber that has been isolated from the plant to provide a beneficial effect in the body. PURPOSE  Increase stool bulk.   Ease and regulate bowel movements.   Lower cholesterol.   Marland KitchenSterling INDICATIONS THAT YOU NEED MORE FIBER  Constipation and hemorrhoids.   Uncomplicated diverticulosis (intestine condition) and irritable bowel syndrome.   Weight management.   As a protective measure against hardening of the arteries (atherosclerosis), diabetes, and cancer.   GUIDELINES FOR INCREASING FIBER IN THE DIET  Start adding fiber to the diet slowly. A gradual increase of about 5 more grams (2 slices of whole-wheat bread, 2 servings of most fruits or vegetables, or 1 bowl of high-fiber cereal) per day is best. Too rapid an increase in fiber may result in constipation, flatulence, and bloating.   Drink enough water and fluids to keep your urine clear or pale yellow. Water, juice, or caffeine-free drinks are recommended. Not drinking enough fluid may cause constipation.   Eat a variety of high-fiber foods rather than one type of fiber.   Try to increase your intake of fiber through using high-fiber foods rather than fiber pills  or supplements that contain small amounts of fiber.   The goal is to change the types of food eaten. Do not supplement your present diet with high-fiber foods, but replace foods in your present diet.   INCLUDE A VARIETY OF FIBER SOURCES  Replace refined and processed grains with whole grains, canned fruits with fresh fruits, and incorporate other fiber sources. White rice, white breads, and most bakery goods contain little or no fiber.   Brown whole-grain rice, buckwheat oats, and many fruits and vegetables are all good sources of fiber. These include: broccoli, Brussels sprouts, cabbage, cauliflower, beets, sweet potatoes, white potatoes (skin on), carrots, tomatoes, eggplant, squash, berries, fresh fruits, and dried fruits.   Cereals appear to be the richest source of fiber. Cereal fiber is found in whole grains and bran. Bran is the fiber-rich outer coat of cereal grain, which is largely removed in refining. In whole-grain cereals, the bran remains. In breakfast cereals, the largest amount of fiber is found in those with "bran" in their names. The fiber content is sometimes indicated on the label.   You may need to include additional fruits and vegetables each day.   In baking, for 1 cup white flour, you may use the following substitutions:   1 cup whole-wheat flour minus 2 tablespoons.   1/2 cup white flour plus 1/2 cup whole-wheat flour.

## 2015-08-11 NOTE — Progress Notes (Signed)
ON RECALL  °

## 2015-08-11 NOTE — Progress Notes (Signed)
cc'ed to pcp °

## 2015-08-11 NOTE — Progress Notes (Signed)
   Subjective:    Patient ID: Benjamin Chaney, male    DOB: [birth year], 69 y.o.   MRN: [id number]  TAPPER,DAVID B, MD   HPI LAST TOOK ABX > 2 MOS AGO. HAS TROUBLE WITH GAS BUT BMs: nl. WHEN ON VACATION NO DIARRHEA. FOR PAST 2 MOS DIDN'T EAT ANYTHING AT HOME AND DOESN'T TAKE MEDS FOR DIARRHEA.   PT DENIES FEVER, CHILLS, HEMATOCHEZIA, nausea, vomiting, melena, diarrhea, CHEST PAIN, SHORTNESS OF BREATH,  CHANGE IN BOWEL IN HABITS, constipation, abdominal pain, problems swallowing, OR heartburn or indigestion.  Past Medical History  Diagnosis Date  . Hypertension   . Colon cancer 1997    left hemicolectomy   . Hypercholesterolemia    Past Surgical History  Procedure Laterality Date  . Colon surgery    . Back surgery    . Colonoscopy  2004    Dr. Lindalou Hose: normal  . Hernia repair      in his 81s  . Colonoscopy N/A 03/11/2014    NL ILEUM, & COLON Bx, POLYPOID LESIONS, SML IH, NL ANASTOMOSIS  . Bacterial overgrowth test N/A 10/01/2014    POSITIVE FOR SIBO   No Known Allergies  Current Outpatient Prescriptions  Medication Sig Dispense Refill  . aspirin 325 MG tablet Take 325 mg by mouth daily.    . benazepril (LOTENSIN) 40 MG tablet Take 20-40 mg by mouth 2 (two) times daily. 1 tablet in am and 1/2 tablet at night    . CARTIA XT 240 MG 24 hr capsule Take 1 capsule by mouth daily.    .      . dicyclomine (BENTYL) 10 MG capsule Take 1-2 before breakfast and before lunch for diarrhea as needed. NONE IN MOS   . doxazosin (CARDURA) 4 MG tablet Take 4 mg by mouth at bedtime.     . dutasteride (AVODART) 0.5 MG capsule Take 0.5 mg by mouth daily.    . furosemide (LASIX) 40 MG tablet Take 40 mg by mouth daily.    . Red Yeast Rice Extract (RED YEAST RICE PO) Take 1 tablet by mouth once a week.     .       Review of Systems PER HPI OTHERWISE ALL SYSTEMS ARE NEGATIVE.    Objective:   Physical Exam  Constitutional: He is oriented to person, place, and time. He appears well-developed and  well-nourished. No distress.  HENT:  Head: Normocephalic and atraumatic.  Mouth/Throat: Oropharynx is clear and moist. No oropharyngeal exudate.  Eyes: Pupils are equal, round, and reactive to light. No scleral icterus.  Neck: Normal range of motion. Neck supple.  Cardiovascular: Normal rate, regular rhythm and normal heart sounds.   Pulmonary/Chest: Effort normal and breath sounds normal. No respiratory distress.  Abdominal: Soft. Bowel sounds are normal. He exhibits no distension. There is no tenderness.  Musculoskeletal: He exhibits no edema.  Lymphadenopathy:    He has no cervical adenopathy.  Neurological: He is alert and oriented to person, place, and time.  Psychiatric: He has a normal mood and affect.  Vitals reviewed.     Assessment & Plan:

## 2015-08-11 NOTE — Assessment & Plan Note (Signed)
SYMPTOMS CONTROLLED/RESOLVED.  CONTINUE TO MONITOR SYMPTOMS. BENTYL PRN FOLLOW UP IN 6 MOS.

## 2015-10-14 ENCOUNTER — Telehealth: Payer: Self-pay | Admitting: Gastroenterology

## 2015-10-14 MED ORDER — CIPROFLOXACIN HCL 500 MG PO TABS: ORAL_TABLET | ORAL | Status: DC

## 2015-10-14 MED ORDER — METRONIDAZOLE 500 MG PO TABS: 500.0000 mg | ORAL_TABLET | Freq: Two times a day (BID) | ORAL | Status: DC

## 2015-10-14 NOTE — Telephone Encounter (Signed)
Patient called to see if SF would call something in to the [facility] for his diarrhea. He is due for a follow up in April and said if he needed to be seen sooner to let him know, but he would rather have a prescription for diarrhea sent in first.

## 2015-10-14 NOTE — Addendum Note (Signed)
Addended by: Danie Binder on: 10/14/2015 04:57 PM   Modules accepted: Orders

## 2015-10-14 NOTE — Telephone Encounter (Signed)
PLEASE CALL PT. I SENT ABX FOR HIS DIARRHEA. HE SHOULD TAKE CIPRO/FLAGYL BID FOR 5 DAYS. MEDICATION SIDE EFFECTS INCLUDE HEEL PAIN, NAUSEA, VOMITING. PATIENT SHOULD AVOID ALCOHOL AND COUGH SYRUP WITH ALCOHOL.  HE MAY USE BENTYL AS NEEDED FOR DIARRHEA. CALL IN 7 DAYS IF HIS DIARRHEA IS NOT BETTER.

## 2015-10-14 NOTE — Telephone Encounter (Signed)
Pt said he has been having diarrhea 4-5 times a day for the last week. He is having a lot of cramping also. Please advise!

## 2015-10-15 MED ORDER — DICYCLOMINE HCL 10 MG PO CAPS: ORAL_CAPSULE | ORAL | Status: DC

## 2015-10-15 NOTE — Telephone Encounter (Signed)
PLEASE CALL PT.  Rx sent.  

## 2015-10-15 NOTE — Telephone Encounter (Signed)
Called and informed wife

## 2015-10-15 NOTE — Addendum Note (Signed)
Addended by: Danie Binder on: 10/15/2015 08:45 AM   Modules accepted: Orders

## 2015-10-15 NOTE — Telephone Encounter (Signed)
PT is aware. He needs a prescription for the Bentyl sent to the pharmacy.

## 2016-01-20 ENCOUNTER — Encounter: Payer: Self-pay | Admitting: Gastroenterology

## 2016-03-02 ENCOUNTER — Ambulatory Visit (INDEPENDENT_AMBULATORY_CARE_PROVIDER_SITE_OTHER): Payer: Managed Care, Other (non HMO) | Admitting: Gastroenterology

## 2016-03-02 ENCOUNTER — Encounter: Payer: Self-pay | Admitting: Gastroenterology

## 2016-03-02 VITALS — BP 137/94 | HR 82 | Temp 98.8°F | Ht 69.0 in | Wt 184.0 lb

## 2016-03-02 DIAGNOSIS — K6389 Other specified diseases of intestine: Secondary | ICD-10-CM | POA: Diagnosis not present

## 2016-03-02 DIAGNOSIS — R195 Other fecal abnormalities: Secondary | ICD-10-CM

## 2016-03-02 DIAGNOSIS — Z85038 Personal history of other malignant neoplasm of large intestine: Secondary | ICD-10-CM | POA: Diagnosis not present

## 2016-03-02 NOTE — Progress Notes (Signed)
ON RECALL  °

## 2016-03-02 NOTE — Assessment & Plan Note (Signed)
SYMPTOMS FAIRLY WELL CONTROLLED.  PLEASE CALL WITH QUESTIONS OR CONCERNS. BENTYL PRN DRINK WATER EAT FIBER FOLLOW UP IN 6 MOS.

## 2016-03-02 NOTE — Patient Instructions (Signed)
PLEASE CALL WITH QUESTIONS OR CONCERNS.  TAKE DICYCLOMINE 10 MG TABLETS ONE 30 MINUTES PRIOR MEALS UP TO THREE TIMES A DAY AS NEEDED FOR ABDOMINAL PAIN OR DIARRHEA. IT MAY CAUSE DROWSINESS, DRY EYES/MOUTH, BLURRY VISION, OR DIFFICULTY URINATING.  DRINK WATER TO KEEP YOUR URINE LIGHT YELLOW.  FOLLOW A HIGH FIBER DIET. AVOID ITEMS THAT CAUSE BLOATING & GAS. SEE INFO BELOW.  FOLLOW UP IN 6 MOS.  High-Fiber Diet A high-fiber diet changes your normal diet to include more whole grains, legumes, fruits, and vegetables. Changes in the diet involve replacing refined carbohydrates with unrefined foods. The calorie level of the diet is essentially unchanged. The Dietary Reference Intake (recommended amount) for adult males is 38 grams per day. For adult females, it is 25 grams per day. Pregnant and lactating women should consume 28 grams of fiber per day. Fiber is the intact part of a plant that is not broken down during digestion. Functional fiber is fiber that has been isolated from the plant to provide a beneficial effect in the body. PURPOSE  Increase stool bulk.   Ease and regulate bowel movements.   Lower cholesterol.  INDICATIONS THAT YOU NEED MORE FIBER  Constipation and hemorrhoids.   Uncomplicated diverticulosis (intestine condition) and irritable bowel syndrome.   Weight management.   As a protective measure against hardening of the arteries (atherosclerosis), diabetes, and cancer.   GUIDELINES FOR INCREASING FIBER IN THE DIET  Start adding fiber to the diet slowly. A gradual increase of about 5 more grams (2 slices of whole-wheat bread, 2 servings of most fruits or vegetables, or 1 bowl of high-fiber cereal) per day is best. Too rapid an increase in fiber may result in constipation, flatulence, and bloating.   Drink enough water and fluids to keep your urine clear or pale yellow. Water, juice, or caffeine-free drinks are recommended. Not drinking enough fluid may cause  constipation.   Eat a variety of high-fiber foods rather than one type of fiber.   Try to increase your intake of fiber through using high-fiber foods rather than fiber pills or supplements that contain small amounts of fiber.   The goal is to change the types of food eaten. Do not supplement your present diet with high-fiber foods, but replace foods in your present diet.   INCLUDE A VARIETY OF FIBER SOURCES  Replace refined and processed grains with whole grains, canned fruits with fresh fruits, and incorporate other fiber sources. White rice, white breads, and most bakery goods contain little or no fiber.   Brown whole-grain rice, buckwheat oats, and many fruits and vegetables are all good sources of fiber. These include: broccoli, Brussels sprouts, cabbage, cauliflower, beets, sweet potatoes, white potatoes (skin on), carrots, tomatoes, eggplant, squash, berries, fresh fruits, and dried fruits.   Cereals appear to be the richest source of fiber. Cereal fiber is found in whole grains and bran. Bran is the fiber-rich outer coat of cereal grain, which is largely removed in refining. In whole-grain cereals, the bran remains. In breakfast cereals, the largest amount of fiber is found in those with "bran" in their names. The fiber content is sometimes indicated on the label.   You may need to include additional fruits and vegetables each day.   In baking, for 1 cup white flour, you may use the following substitutions:   1 cup whole-wheat flour minus 2 tablespoons.   1/2 cup white flour plus 1/2 cup whole-wheat flour.

## 2016-03-02 NOTE — Progress Notes (Signed)
cc'ed to pcp °

## 2016-03-02 NOTE — Assessment & Plan Note (Signed)
SYMPTOMS CONTROLLED/RESOLVED.  CONTINUE TO MONITOR SYMPTOMS. 

## 2016-03-02 NOTE — Progress Notes (Signed)
   Subjective:    Patient ID: Benjamin Chaney, male    DOB: 12-Dec-1945, 70 y.o.   MRN: [id number]  Deloria Lair, MD  HPI FEELING BETTER. ABX WORKED. OCCASIONALLY USES BLUE PILLS WHEN SYMPTOMS START TAKES EVERY OTHER WEEK AND SOMETIMES MORE OFTEN. BMs: ONCE(SOFT/NORMAL). APPETITE: GOOD. WIFE'S BACK GIVING HER TROUBLE. WORKS AS A MECHANIC STILL. HAD IRREGULAR HR SINCE HE WAS IN HIS 20s. PT DENIES FEVER, CHILLS, HEMATOCHEZIA, HEMATEMESIS, nausea, vomiting, melena, diarrhea, CHEST PAIN, SHORTNESS OF BREATH,  CHANGE IN BOWEL IN HABITS, constipation, abdominal pain, problems swallowing, OR heartburn or indigestion.    Past Medical History  Diagnosis Date  . Hypertension   . Colon cancer ([place]) 1997    left hemicolectomy   . Hypercholesterolemia    Past Surgical History  Procedure Laterality Date  . Colon surgery    . Back surgery    . Colonoscopy  2004    Dr. Lindalou Hose: normal  . Hernia repair      in his 31s  . Colonoscopy N/A 03/11/2014    NL ILEUM, & COLON Bx, POLYPOID LESIONS, SML IH, NL ANASTOMOSIS  . Bacterial overgrowth test N/A 10/01/2014    POSITIVE FOR SIBO   No Known Allergies  Current Outpatient Prescriptions  Medication Sig Dispense Refill  . aspirin 325 MG tablet Take 325 mg by mouth daily.    . benazepril (LOTENSIN) 40 MG tablet Take 20-40 mg by mouth 2 (two) times daily. 1 tablet in am and 1/2 tablet at night    . CARTIA XT 240 MG 24 hr capsule Take 1 capsule by mouth daily.    .      . BENTYL 10 MG capsule Take 1-2 before breakfast and before lunch for diarrhea as needed.    . doxazosin (CARDURA) 4 MG tablet Take 4 mg by mouth at bedtime.     . AVODART 0.5 MG capsule Take 0.5 mg by mouth daily.    . furosemide (LASIX) 40 MG tablet Take 40 mg by mouth daily.    . RED YEAST RICE PO Take 1 tablet by mouth once a week.     .      .      Review of Systems PER HPI OTHERWISE ALL SYSTEMS ARE NEGATIVE.    Objective:   Physical Exam  Constitutional: He is oriented to  person, place, and time. He appears well-developed and well-nourished. No distress.  HENT:  Head: Normocephalic and atraumatic.  Mouth/Throat: Oropharynx is clear and moist. No oropharyngeal exudate.  Eyes: Pupils are equal, round, and reactive to light. No scleral icterus.  Neck: Normal range of motion. Neck supple.  Cardiovascular: Normal rate and normal heart sounds.   No murmur heard. Irregular RHYTHM   Pulmonary/Chest: Effort normal and breath sounds normal. No respiratory distress.  Abdominal: Soft. Bowel sounds are normal. He exhibits no distension. There is no tenderness.  Musculoskeletal: He exhibits no edema.  Lymphadenopathy:    He has no cervical adenopathy.  Neurological: He is alert and oriented to person, place, and time.  NO  NEW FOCAL DEFICITS  Psychiatric: He has a normal mood and affect.  Vitals reviewed.     Assessment & Plan:

## 2016-03-02 NOTE — Assessment & Plan Note (Signed)
NO WARNING SIGNS/SYMPTOMS  NEXT TCS IN 2020.

## 2016-06-30 ENCOUNTER — Encounter: Payer: Self-pay | Admitting: Gastroenterology

## 2016-08-06 ENCOUNTER — Other Ambulatory Visit: Payer: Self-pay | Admitting: Gastroenterology

## 2016-08-11 ENCOUNTER — Ambulatory Visit: Payer: Managed Care, Other (non HMO) | Admitting: Gastroenterology

## 2016-08-24 ENCOUNTER — Ambulatory Visit (INDEPENDENT_AMBULATORY_CARE_PROVIDER_SITE_OTHER): Payer: Managed Care, Other (non HMO) | Admitting: Gastroenterology

## 2016-08-24 ENCOUNTER — Encounter: Payer: Self-pay | Admitting: Gastroenterology

## 2016-08-24 DIAGNOSIS — R197 Diarrhea, unspecified: Secondary | ICD-10-CM

## 2016-08-24 MED ORDER — METRONIDAZOLE 500 MG PO TABS: 500.0000 mg | ORAL_TABLET | Freq: Two times a day (BID) | ORAL | 0 refills | Status: DC

## 2016-08-24 MED ORDER — CIPROFLOXACIN HCL 500 MG PO TABS: ORAL_TABLET | ORAL | 0 refills | Status: DC

## 2016-08-24 NOTE — Progress Notes (Signed)
ON RECALL  °

## 2016-08-24 NOTE — Patient Instructions (Addendum)
SUBMIT STOOL STUDIES. IF YOUR C DIFF TEST IS NEGATIVE, TAKE CIPRO/FLAGYL BID FOR 5 DAYS. MEDICATION SIDE EFFECTS INCLUDE HEEL PAIN, NAUSEA, VOMITING. PATIENT SHOULD AVOID ALCOHOL AND COUGH SYRUP WITH ALCOHOL.   USE KAPOECTATE AS NEEDED FOR WATERY STOOLS. IF C DIFF TEST IS NEGATIVE, USE BENTYL(BLUE PILL) TO CONTROL ABDOMINAL PAIN AND STOOLS.  DRINK WATER TO KEEP YOUR URINE LIGHT YELLOW. FOLLOW A DAIRY FREE DIET.  SEE INFO BELOW.  FOLLOW UP IN 3 MOS.    Lactose Free Diet Lactose is a carbohydrate that is found mainly in milk and milk products, as well as in foods with added milk or whey. Lactose must be digested by the enzyme in order to be used by the body. Lactose intolerance occurs when there is a shortage of lactase.When your body is not able to digest lactose, you may feel sick to your stomach (nausea), bloating, cramping, gas and diarrhea.  There are many dairy products that may be tolerated better than milk by some people:  The use of cultured dairy products such as yogurt, buttermilk, cottage cheese, and sweet acidophilus milk (Kefir) for lactase-deficient individuals is usually well tolerated. This is because the healthy bacteria help digest lactose.   Lactose-hydrolyzed milk (Lactaid) contains 40-90% less lactose than milk and may also be well tolerated.   SPECIAL NOTES  Lactose is a carbohydrates. The major food source is dairy products. Reading food labels is important. Many products contain lactose even when they are not made from milk. Look for the following words: whey, milk solids, dry milk solids, nonfat dry milk powder. Typical sources of lactose other than dairy products include breads, candies, cold cuts, prepared and processed foods, and commercial sauces and gravies.   All foods must be prepared without milk, cream, or other dairy foods.   Soy milk and lactose-free supplements (LACTASE) may be used as an alternative to milk.   FOOD GROUP ALLOWED/RECOMMENDED  AVOID/USE SPARINGLY  BREADS / STARCHES 4 servings or more* Breads and rolls made without milk. [place], [place], or [place] bread. Breads and rolls that contain milk. Prepared mixes such as muffins, biscuits, waffles, pancakes. Sweet rolls, donuts, [place] toast (if made with milk or lactose).  Crackers: Soda crackers, graham crackers. Any crackers prepared without lactose. Zwieback crackers, corn curls, or any that contain lactose.  Cereals: Cooked or dry cereals prepared without lactose (read labels). Cooked or dry cereals prepared with lactose (read labels). Total, Cocoa Krispies. Special K.  Potatoes / Pasta / Rice: Any prepared without milk or lactose. Popcorn. Instant potatoes, frozen [place] fries, scalloped or au gratin potatoes.  VEGETABLES 2 servings or more Fresh, frozen, and canned vegetables. Creamed or breaded vegetables. Vegetables in a cheese sauce or with lactose-containing margarines.  FRUIT 2 servings or more All fresh, canned, or frozen fruits that are not processed with lactose. Any canned or frozen fruits processed with lactose.  MEAT & SUBSTITUTES 2 servings or more (4 to 6 oz. total per day) Plain beef, chicken, fish, [place], lamb, veal, pork, or ham. Kosher prepared meat products. Strained or junior meats that do not contain milk. Eggs, soy meat substitutes, nuts. Scrambled eggs, omelets, and souffles that contain milk. Creamed or breaded meat, fish, or fowl. Sausage products such as wieners, liver sausage, or cold cuts that contain milk solids. Cheese, cottage cheese, or cheese spreads.  MILK None. (See "BEVERAGES" for milk substitutes. See "DESSERTS" for ice cream and frozen desserts.) Milk (whole, 2%, skim, or chocolate). Evaporated, powdered, or condensed milk; malted milk.  SOUPS & COMBINATION FOODS Bouillon, broth, vegetable soups, clear soups, consomms. Homemade soups made with allowed ingredients. Combination or prepared foods that do not contain milk or milk products  (read labels). Cream soups, chowders, commercially prepared soups containing lactose. Macaroni and cheese, pizza. Combination or prepared foods that contain milk or milk products.  DESSERTS & SWEETS In moderation Water and fruit ices; gelatin; angel food cake. Homemade cookies, pies, or cakes made from allowed ingredients. Pudding (if made with water or a milk substitute). Lactose-free tofu desserts. Sugar, honey, corn syrup, jam, jelly; marmalade; molasses (beet sugar); Pure sugar candy; marshmallows. Ice cream, ice milk, sherbet, custard, pudding, frozen yogurt. Commercial cake and cookie mixes. Desserts that contain chocolate. Pie crust made with milk-containing margarine; reduced-calorie desserts made with a sugar substitute that contains lactose. Toffee, peppermint, butterscotch, chocolate, caramels.  FATS & OILS In moderation Butter (as tolerated; contains very small amounts of lactose). Margarines and dressings that do not contain milk, Vegetable oils, shortening, Miracle Whip, mayonnaise, nondairy cream & whipped toppings without lactose or milk solids added (examples: Coffee Rich, Carnation Coffeemate, Rich's Whipped Topping, PolyRich). Berniece Salines. Margarines and salad dressings containing milk; cream, cream cheese; peanut butter with added milk solids, sour cream, chip dips, made with sour cream.  BEVERAGES Carbonated drinks; tea; coffee and freeze-dried coffee; some instant coffees (check labels). Fruit drinks; fruit and vegetable juice; Rice or Soy milk. Ovaltine, hot chocolate. Some cocoas; some instant coffees; instant iced teas; powdered fruit drinks (read labels).   CONDIMENTS / MISCELLANEOUS Soy sauce, carob powder, olives, gravy made with water, baker's cocoa, pickles, pure seasonings and spices, wine, pure monosodium glutamate, catsup, mustard. Some chewing gums, chocolate, some cocoas. Certain antibiotics and vitamin / mineral preparations. Spice blends if they contain milk products. MSG  extender. Artificial sweeteners that contain lactose such as Equal (Nutra-Sweet) and Sweet 'n Low. Some nondairy creamers (read labels).   SAMPLE MENU*  Breakfast   Orange Juice.  Banana.   Bran flakes.   Nondairy Creamer.  [place] Bread (toasted).   Butter or milk-free margarine.   Coffee or tea.    Noon Meal   Chicken Breast.  Rice.   Green beans.   Butter or milk-free margarine.  Fresh melon.   Coffee or tea.    Evening Meal   Roast Beef.  Baked potato.   Butter or milk-free margarine.   Broccoli.   Lettuce salad with vinegar and oil dressing.  [employer]   Coffee or tea.

## 2016-08-24 NOTE — Progress Notes (Signed)
CC'ED TO PCP 

## 2016-08-24 NOTE — Progress Notes (Signed)
   Subjective:    Patient ID: Benjamin Chaney, male    DOB: 1946-03-18, 70 y.o.   MRN: [id number]  TAPPER,DAVID B, MD  HPI WAS IN HIS USUAL STATE OF HEALTH AND WOKE UP THIS AM WITH WATERY STOOLS x1-3. WAS ON VACATION LAST WEEK. NO BLOOD IN STOOL. HAD SEAFOOD(WHITING). NO RAW CLAMS OR OYSTERS. MILD LEFT PERIUMBILICAL ABDOMINAL CRAMPING THIS AM. WIFE FELT A LITTLE ILL LAST WEEK. USUALLY TAKES BLUE PILLS AND IT HELPS. TAKES OFF AND ON BUT MAY GO ONE WEEK WITHOUT USING IT. LAST WATERY STOOL ~1 MO AGO. NO ABX, OR FRESH WATER. RARE CRAMPS IN HANDS. WENT BY YESTERDAY TO [place] VISIT.  PT DENIES FEVER, CHILLS, HEMATOCHEZIA, nausea, vomiting, melena, CHEST PAIN, SHORTNESS OF BREATH,  CHANGE IN BOWEL IN HABITS, constipation, problems swallowing, OR heartburn or indigestion.  Past Medical History:  Diagnosis Date  . Colon cancer ([place]) 1997   left hemicolectomy   . Hypercholesterolemia   . Hypertension    Past Surgical History:  Procedure Laterality Date  . BACK SURGERY    . BACTERIAL OVERGROWTH TEST N/A 10/01/2014   POSITIVE FOR SIBO  . COLON SURGERY    . COLONOSCOPY  2004   Dr. Lindalou Hose: normal  . COLONOSCOPY N/A 03/11/2014   NL ILEUM, & COLON Bx, POLYPOID LESIONS, SML IH, NL ANASTOMOSIS  . HERNIA REPAIR     in his 30s   No Known Allergies  Current Outpatient Prescriptions  Medication Sig Dispense Refill  . aspirin 325 MG tablet Take 325 mg by mouth daily.    . benazepril (LOTENSIN) 40 MG tablet Take 20-40 mg by mouth 2 (two) times daily. 1 tablet in am and 1/2 tablet at night    . CARTIA XT 240 MG 24 hr capsule Take 1 capsule by mouth daily.    .      .      . dicyclomine (BENTYL) 10 MG capsule TAKE ONE TO TWO CAPSULES BEFORE BREAKFAST AND BEFORE LUNCH FOR DIARRHEA AS NEEDED    . doxazosin (CARDURA) 4 MG tablet Take 4 mg by mouth at bedtime.     . dutasteride (AVODART) 0.5 MG capsule Take 0.5 mg by mouth daily.    . furosemide (LASIX) 40 MG tablet Take 40 mg by mouth daily.    .      Scarlett Presto Yeast Rice Extract (RED YEAST RICE PO) Take 1 tablet by mouth once a week.      Review of Systems PER HPI OTHERWISE ALL SYSTEMS ARE NEGATIVE.    Objective:   Physical Exam  Constitutional: He is oriented to person, place, and time. He appears well-developed and well-nourished. No distress.  HENT:  Head: Normocephalic and atraumatic.  Mouth/Throat: Oropharynx is clear and moist. No oropharyngeal exudate.  Eyes: Pupils are equal, round, and reactive to light. No scleral icterus.  Neck: Normal range of motion. Neck supple.  Cardiovascular: Normal rate and normal heart sounds.   Irregular RHYTHM  Pulmonary/Chest: Effort normal and breath sounds normal. No respiratory distress.  Abdominal: Soft. Bowel sounds are normal. He exhibits no distension. There is no tenderness.  MIDLINE BULGE THAT INCREASES WITH VALSALVA, REDUCIBLE   Musculoskeletal: He exhibits no edema.  Lymphadenopathy:    He has no cervical adenopathy.  Neurological: He is alert and oriented to person, place, and time.  NO  NEW FOCAL DEFICITS  Psychiatric: He has a normal mood and affect.  Vitals reviewed.     Assessment & Plan:

## 2016-08-24 NOTE — Assessment & Plan Note (Addendum)
SYMPTOMS NOT IDEALLY CONTROLLED. WAS FEELING WELL THEN WOKE UP THIS AM WITH DIARRHEA. Acute in onset and associated withy visits to NURSING facility. DIFFERENTIAL DIAGNOSIS INCLUDES: FLARE OF SMALL INTESTINE BACTERIAL OVERGROWTH, LESS LIKELY GIARDIASIS, C DIFF COLITIS, OR IBD.  DRINK WATER TO KEEP YOUR URINE LIGHT YELLOW. FOLLOW A DAIRY FREE DIET.  HANDOUT GIVEN.  SUBMIT STOOL STUDIES. IF C DIFF PCR IS NEGATIVE, TAKE CIPRO/FLAGYL BID FOR 5 DAYS. MEDICATION SIDE EFFECTS INCLUDE HEEL PAIN, NAUSEA, VOMITING. PATIENT SHOULD AVOID ALCOHOL AND COUGH SYRUP WITH ALCOHOL.   USE KAPOECTATE AS NEEDED FOR WATERY STOOLS. IF C DIFF PCR IS NEGATIVE, USE BENTYL TO CONTROL ABDOMINAL PAIN AND STOOLS.  FOLLOW UP IN 3 MOS.

## 2016-08-26 LAB — CLOSTRIDIUM DIFFICILE BY PCR: Toxigenic C. Difficile by PCR: NOT DETECTED

## 2016-08-29 NOTE — Progress Notes (Signed)
Pt is aware.  

## 2016-10-11 ENCOUNTER — Encounter: Payer: Self-pay | Admitting: Gastroenterology

## 2016-12-07 ENCOUNTER — Ambulatory Visit (INDEPENDENT_AMBULATORY_CARE_PROVIDER_SITE_OTHER): Payer: Managed Care, Other (non HMO) | Admitting: Gastroenterology

## 2016-12-07 ENCOUNTER — Encounter: Payer: Self-pay | Admitting: Gastroenterology

## 2016-12-07 DIAGNOSIS — Z85038 Personal history of other malignant neoplasm of large intestine: Secondary | ICD-10-CM | POA: Diagnosis not present

## 2016-12-07 DIAGNOSIS — K6389 Other specified diseases of intestine: Secondary | ICD-10-CM | POA: Diagnosis not present

## 2016-12-07 NOTE — Progress Notes (Signed)
   Subjective:    Patient ID: Benjamin Chaney, male    DOB: [birth year], 71 y.o.   MRN: [id number]  TAPPER,DAVID B, MD  HPI Diarrhea fairly well controlled. Last used medicines 1 mo ago. Flares may last 3 hrs at the most. OTHERWISE HAS A NL STOOL. MAY HAVE GAS EVERY DAY. NO BLOATING USU BUT EVER NOW AND THEN HIS HERNIA MAKES NOISE WHEN HE SQUEEZES ON IT.   PT DENIES FEVER, CHILLS, HEMATOCHEZIA, nausea, vomiting, melena,  CHEST PAIN, SHORTNESS OF BREATH,  CHANGE IN BOWEL IN HABITS, constipation, abdominal pain, problems swallowing, OR heartburn or indigestion.  Past Medical History:  Diagnosis Date  . Colon cancer ([place]) 1997   left hemicolectomy   . Hypercholesterolemia   . Hypertension    Past Surgical History:  Procedure Laterality Date  . BACK SURGERY    . BACTERIAL OVERGROWTH TEST N/A 10/01/2014   POSITIVE FOR SIBO  . COLON SURGERY    . COLONOSCOPY  2004   Dr. Lindalou Hose: normal  . COLONOSCOPY N/A 03/11/2014   NL ILEUM, & COLON Bx, POLYPOID LESIONS, SML IH, NL ANASTOMOSIS  . HERNIA REPAIR     in his 75s   No Known Allergies  Current Outpatient Prescriptions  Medication Sig Dispense Refill  . aspirin 325 MG tablet Take 325 mg by mouth daily.    . benazepril (LOTENSIN) 40 MG tablet Take 20-40 mg by mouth 2 (two) times daily. 1 tablet in am and 1/2 tablet at night    . CARTIA XT 240 MG 24 hr capsule Take 1 capsule by mouth daily.    .      . dicyclomine (BENTYL) 10 MG capsule TAKE ONE TO TWO CAPSULES BEFORE BREAKFAST AND BEFORE LUNCH FOR DIARRHEA AS NEEDED 90 capsule 5  . doxazosin (CARDURA) 4 MG tablet Take 4 mg by mouth at bedtime.     . dutasteride (AVODART) 0.5 MG capsule Take 0.5 mg by mouth daily.    . furosemide (LASIX) 40 MG tablet Take 40 mg by mouth daily.    . Red Yeast Rice Extract (RED YEAST RICE PO) Take 1 tablet by mouth once a week.     .       Review of Systems PER HPI OTHERWISE ALL SYSTEMS ARE NEGATIVE.    Objective:   Physical Exam  Constitutional: He  is oriented to person, place, and time. He appears well-developed and well-nourished. No distress.  HENT:  Head: Normocephalic and atraumatic.  Mouth/Throat: Oropharynx is clear and moist. No oropharyngeal exudate.  Eyes: Pupils are equal, round, and reactive to light. No scleral icterus.  Neck: Normal range of motion. Neck supple.  Cardiovascular: Normal rate, regular rhythm and normal heart sounds.   Pulmonary/Chest: Effort normal and breath sounds normal. No respiratory distress.  Abdominal: Soft. Bowel sounds are normal. He exhibits no distension. There is no tenderness.  Musculoskeletal: He exhibits no edema.  Lymphadenopathy:    He has no cervical adenopathy.  Neurological: He is alert and oriented to person, place, and time.  Psychiatric: He has a normal mood and affect.  Vitals reviewed.     Assessment & Plan:

## 2016-12-07 NOTE — Assessment & Plan Note (Signed)
SYMPTOMS FAIRLY WELL CONTROLLED.  Use BENTYL IF NEEDED. CALL WITH QUESTIONS OR CONCERNS. IF FLARES OF DIARRHEA BECOME MORE FREQUENT, RECOMMEND CIP/FLAGYL BID FOR 5 DAYS. FOLLOW UP IN 6 MOS.

## 2016-12-07 NOTE — Assessment & Plan Note (Signed)
LAST TCS 2015.  NEXT COLONOSCOPY 2020. EXPLAINED TO PT WE DO NOT ROUTINELY SCREEN AFTER AGE 71 BUT WE SHOULD ALWAYS HAVE A CONVERSATION ABOUT THE BENEFITS V. THE RISKS OF A COLONOSCOPY.

## 2016-12-07 NOTE — Patient Instructions (Signed)
Use BENTYL IF NEEDED.  PLEASE CALL WITH QUESTIONS OR CONCERNS.  FOLLOW UP IN 6 MOS.

## 2016-12-08 NOTE — Progress Notes (Signed)
ON RECALL  °

## 2016-12-08 NOTE — Progress Notes (Signed)
cc'ed to pcp °

## 2017-04-01 ENCOUNTER — Emergency Department (HOSPITAL_COMMUNITY)
Admission: EM | Admit: 2017-04-01 | Discharge: 2017-04-01 | Disposition: A | Discharge status: Home / Self Care | Payer: Managed Care, Other (non HMO) | Attending: Emergency Medicine | Admitting: Emergency Medicine

## 2017-04-01 ENCOUNTER — Encounter (HOSPITAL_COMMUNITY): Payer: Self-pay | Admitting: *Deleted

## 2017-04-01 DIAGNOSIS — Z7982 Long term (current) use of aspirin: Secondary | ICD-10-CM | POA: Diagnosis not present

## 2017-04-01 DIAGNOSIS — I10 Essential (primary) hypertension: Secondary | ICD-10-CM | POA: Diagnosis not present

## 2017-04-01 DIAGNOSIS — N3001 Acute cystitis with hematuria: Secondary | ICD-10-CM | POA: Diagnosis not present

## 2017-04-01 DIAGNOSIS — R319 Hematuria, unspecified: Secondary | ICD-10-CM | POA: Diagnosis present

## 2017-04-01 DIAGNOSIS — Z79899 Other long term (current) drug therapy: Secondary | ICD-10-CM | POA: Insufficient documentation

## 2017-04-01 LAB — CBC WITH DIFFERENTIAL/PLATELET
Basophils Absolute: 0 10*3/uL (ref 0.0–0.1)
Basophils Relative: 0 %
EOS PCT: 1 %
Eosinophils Absolute: 0 10*3/uL (ref 0.0–0.7)
HCT: 36.1 % — ABNORMAL LOW (ref 39.0–52.0)
HEMOGLOBIN: 11.4 g/dL — AB (ref 13.0–17.0)
LYMPHS ABS: 3 10*3/uL (ref 0.7–4.0)
Lymphocytes Relative: 53 %
MCH: 23.9 pg — ABNORMAL LOW (ref 26.0–34.0)
MCHC: 31.6 g/dL (ref 30.0–36.0)
MCV: 75.8 fL — ABNORMAL LOW (ref 78.0–100.0)
Monocytes Absolute: 0.4 10*3/uL (ref 0.1–1.0)
Monocytes Relative: 7 %
NEUTROS PCT: 39 %
Neutro Abs: 2.2 10*3/uL (ref 1.7–7.7)
Platelets: 182 10*3/uL (ref 150–400)
RBC: 4.76 MIL/uL (ref 4.22–5.81)
RDW: 14 % (ref 11.5–15.5)
WBC: 5.7 10*3/uL (ref 4.0–10.5)

## 2017-04-01 LAB — URINALYSIS, ROUTINE W REFLEX MICROSCOPIC
Bilirubin Urine: NEGATIVE
Glucose, UA: NEGATIVE mg/dL
Ketones, ur: NEGATIVE mg/dL
Leukocytes, UA: NEGATIVE
Nitrite: NEGATIVE
PH: 5 (ref 5.0–8.0)
Protein, ur: 100 mg/dL — AB
Specific Gravity, Urine: 1.017 (ref 1.005–1.030)

## 2017-04-01 LAB — BASIC METABOLIC PANEL
Anion gap: 7 (ref 5–15)
BUN: 24 mg/dL — AB (ref 6–20)
CHLORIDE: 113 mmol/L — AB (ref 101–111)
CO2: 26 mmol/L (ref 22–32)
Calcium: 9 mg/dL (ref 8.9–10.3)
Creatinine, Ser: 1.73 mg/dL — ABNORMAL HIGH (ref 0.61–1.24)
GFR calc Af Amer: 44 mL/min — ABNORMAL LOW (ref >60)
GFR calc non Af Amer: 38 mL/min — ABNORMAL LOW (ref >60)
GLUCOSE: 100 mg/dL — AB (ref 65–99)
Potassium: 3.9 mmol/L (ref 3.5–5.1)
Sodium: 146 mmol/L — ABNORMAL HIGH (ref 135–145)

## 2017-04-01 MED ORDER — CEPHALEXIN 500 MG PO CAPS: 500.0000 mg | ORAL_CAPSULE | Freq: Four times a day (QID) | ORAL | 0 refills | Status: DC

## 2017-04-01 MED ORDER — CEPHALEXIN 500 MG PO CAPS
500.0000 mg | ORAL_CAPSULE | Freq: Once | ORAL | Status: AC
  Administered 2017-04-01: 500 mg via ORAL
  Filled 2017-04-01: qty 1

## 2017-04-01 NOTE — ED Triage Notes (Signed)
Pt noted blood in urine today- dark in color per pt.  Pt denies burning on urination or any pain at all.

## 2017-04-01 NOTE — ED Notes (Signed)
Pt stated that he has not taken his B/P med but will take it when he gets home. He normally takes it at 2100 hrs.

## 2017-04-01 NOTE — ED Triage Notes (Signed)
Pt reports blood in his urine   Dr Deatra Ina is his PCP

## 2017-04-01 NOTE — ED Provider Notes (Signed)
[facility] DEPT Provider Note   CSN: [id number] Arrival date & time: 04/01/17  2034     History   Chief Complaint Chief Complaint  Patient presents with  . Hematuria    HPI Benjamin Chaney is a 71 y.o. male.  Pt presents to the ED today with blood in urine.  The pt noticed it today.  He has no pain with urination.  He denies any trouble urinating. He denies any trauma.  He said this happened in the past about 5 years ago.  Pt denies taking any blood thinners.      Past Medical History:  Diagnosis Date  . Colon cancer ([place]) 1997   left hemicolectomy   . Hypercholesterolemia   . Hypertension     Patient Active Problem List   Diagnosis Date Noted  . Small intestinal bacterial overgrowth 12/25/2014  . History of colon cancer 03/05/2014    Past Surgical History:  Procedure Laterality Date  . BACK SURGERY    . BACTERIAL OVERGROWTH TEST N/A 10/01/2014   POSITIVE FOR SIBO  . COLON SURGERY    . COLONOSCOPY  2004   Dr. Lindalou Hose: normal  . COLONOSCOPY N/A 03/11/2014   NL ILEUM, & COLON Bx, POLYPOID LESIONS, SML IH, NL ANASTOMOSIS  . HERNIA REPAIR     in his 47s       Home Medications    Prior to Admission medications   Medication Sig Start Date End Date Taking? Authorizing Provider  aspirin 325 MG tablet Take 325 mg by mouth daily.    [provider]  benazepril (LOTENSIN) 40 MG tablet Take 20-40 mg by mouth 2 (two) times daily. 1 tablet in am and 1/2 tablet at night 02/19/14   [provider]  CARTIA XT 240 MG 24 hr capsule Take 1 capsule by mouth daily. 02/27/14   [provider]  cephALEXin (KEFLEX) 500 MG capsule Take 1 capsule (500 mg total) by mouth 4 (four) times daily. 04/01/17   Isla Pence, MD  ciprofloxacin (CIPRO) 500 MG tablet 1 PO BID FOR 5 DAYS 08/24/16   Fields, Marga Melnick, MD  dicyclomine (BENTYL) 10 MG capsule TAKE ONE TO TWO CAPSULES BEFORE BREAKFAST AND BEFORE LUNCH FOR DIARRHEA AS NEEDED 08/08/16   Carlis Stable, NP    doxazosin (CARDURA) 4 MG tablet Take 4 mg by mouth at bedtime.  03/01/14   [provider]  dutasteride (AVODART) 0.5 MG capsule Take 0.5 mg by mouth daily.    [provider]  furosemide (LASIX) 40 MG tablet Take 40 mg by mouth daily.    [provider]  metroNIDAZOLE (FLAGYL) 500 MG tablet Take 1 tablet (500 mg total) by mouth 2 (two) times daily. For five days. Take with cipro. Patient not taking: Reported on 12/07/2016 08/24/16   Danie Binder, MD  Red Yeast Rice Extract (RED YEAST RICE PO) Take 1 tablet by mouth once a week.     [provider]    Family History Family History  Problem Relation Age of Onset  . Colon cancer Neg Hx   . Colon polyps Neg Hx     Social History Social History  Substance Use Topics  . Smoking status: Never Smoker  . Smokeless tobacco: Never Used     Comment: Never smoked  . Alcohol use No     Allergies   Patient has no known allergies.   Review of Systems Review of Systems  Genitourinary: Positive for hematuria.  Physical Exam Updated Vital Signs BP (!) 144/122 (BP Location: Left Arm)   Pulse 75   Temp 97.7 F (36.5 C) (Oral)   Resp 18   Ht 5\' 9"  (1.753 m)   Wt 83.9 kg (185 lb)   SpO2 99%   BMI 27.32 kg/m   Physical Exam  Constitutional: He is oriented to person, place, and time. He appears well-developed and well-nourished.  HENT:  Head: Normocephalic and atraumatic.  Right Ear: External ear normal.  Left Ear: External ear normal.  Nose: Nose normal.  Mouth/Throat: Oropharynx is clear and moist.  Eyes: Conjunctivae and EOM are normal. Pupils are equal, round, and reactive to light.  Neck: Normal range of motion. Neck supple.  Cardiovascular: Normal rate, regular rhythm, normal heart sounds and intact distal pulses.   Pulmonary/Chest: Effort normal and breath sounds normal.  Abdominal: Soft. Bowel sounds are normal.  Genitourinary: Testes normal and penis normal. Uncircumcised.   Musculoskeletal: Normal range of motion.  Neurological: He is alert and oriented to person, place, and time.  Skin: Skin is warm.  Psychiatric: He has a normal mood and affect. His behavior is normal. Judgment and thought content normal.  Nursing note and vitals reviewed.    ED Treatments / Results  Labs (all labs ordered are listed, but only abnormal results are displayed) Labs Reviewed  URINALYSIS, ROUTINE W REFLEX MICROSCOPIC - Abnormal; Notable for the following:       Result Value   Color, Urine BROWN (*)    APPearance TURBID (*)    Hgb urine dipstick MODERATE (*)    Protein, ur 100 (*)    Bacteria, UA MANY (*)    Squamous Epithelial / LPF 0-5 (*)    All other components within normal limits  BASIC METABOLIC PANEL - Abnormal; Notable for the following:    Sodium 146 (*)    Chloride 113 (*)    Glucose, Bld 100 (*)    BUN 24 (*)    Creatinine, Ser 1.73 (*)    GFR calc non Af Amer 38 (*)    GFR calc Af Amer 44 (*)    All other components within normal limits  CBC WITH DIFFERENTIAL/PLATELET - Abnormal; Notable for the following:    Hemoglobin 11.4 (*)    HCT 36.1 (*)    MCV 75.8 (*)    MCH 23.9 (*)    All other components within normal limits  URINE CULTURE    EKG  EKG Interpretation None       Radiology No results found.  Procedures Procedures (including critical care time)  Medications Ordered in ED Medications  cephALEXin (KEFLEX) capsule 500 mg (500 mg Oral Given 04/01/17 2215)     Initial Impression / Assessment and Plan / ED Course  I have reviewed the triage vital signs and the nursing notes.  Pertinent labs & imaging results that were available during my care of the patient were reviewed by me and considered in my medical decision making (see chart for details).    Pt treated with keflex for UTI.  Urine sent for culture.  He is instructed to f/u with urology.  Final Clinical Impressions(s) / ED Diagnoses   Final diagnoses:  Acute cystitis  with hematuria    New Prescriptions Discharge Medication List as of 04/01/2017 10:16 PM    START taking these medications   Details  cephALEXin (KEFLEX) 500 MG capsule Take 1 capsule (500 mg total) by mouth 4 (four) times daily., Starting Sat 04/01/2017, Print  Isla Pence, MD 04/02/17 1510

## 2017-04-03 LAB — URINE CULTURE

## 2017-04-26 ENCOUNTER — Encounter: Payer: Self-pay | Admitting: Gastroenterology

## 2017-05-22 DIAGNOSIS — N401 Enlarged prostate with lower urinary tract symptoms: Secondary | ICD-10-CM

## 2017-05-22 DIAGNOSIS — N138 Other obstructive and reflux uropathy: Secondary | ICD-10-CM | POA: Insufficient documentation

## 2017-06-06 ENCOUNTER — Other Ambulatory Visit: Payer: Self-pay | Admitting: Nurse Practitioner

## 2017-07-20 ENCOUNTER — Encounter: Payer: Self-pay | Admitting: Gastroenterology

## 2017-07-20 ENCOUNTER — Ambulatory Visit (INDEPENDENT_AMBULATORY_CARE_PROVIDER_SITE_OTHER): Payer: Managed Care, Other (non HMO) | Admitting: Gastroenterology

## 2017-07-20 DIAGNOSIS — K6389 Other specified diseases of intestine: Secondary | ICD-10-CM

## 2017-07-20 DIAGNOSIS — R195 Other fecal abnormalities: Secondary | ICD-10-CM

## 2017-07-20 NOTE — Progress Notes (Signed)
   Subjective:    Patient ID: Benjamin Chaney, male    DOB: 1945-12-30, 71 y.o.   MRN: [id number]  Deloria Lair., MD   HPI USING BENTYL ABOUT TWICE DAILY. BOWEL MOVE 3-5 TIMES A DAY(#6). CAN HAVE NL FORMED STOOLS:2-3X/WEEK. MAY ALSO HAVE WATERY STOOLS. EATS CHEESE: 6 DAYS A WEEK(AT NIGHT). SOMETHING TRIGGERS AND EVERYTHING GOES CRAZY. WITH CIP/FLAGYL NOTICED IMPROVEMENT IN HIS BOWEL MOVEMENTS. RARE PAIN IN ABDOMEN WHEN HERNIA FLARES UP. 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 Has trouble seeing: really nearsighted.  PT DENIES FEVER, CHILLS, HEMATOCHEZIA, HEMATEMESIS, nausea, vomiting, melena, CHEST PAIN, SHORTNESS OF BREATH, CHANGE IN BOWEL IN HABITS, constipation, problems swallowing, OR heartburn or indigestion.  Past Medical History:  Diagnosis Date  . Colon cancer ([place]) 1997   left hemicolectomy   . Hypercholesterolemia   . Hypertension    Past Surgical History:  Procedure Laterality Date  . BACK SURGERY    . BACTERIAL OVERGROWTH TEST N/A 10/01/2014   POSITIVE FOR SIBO  . COLON SURGERY    . COLONOSCOPY  2004   Dr. Lindalou Hose: normal  . COLONOSCOPY N/A 03/11/2014   NL ILEUM, & COLON Bx, POLYPOID LESIONS, SML IH, NL ANASTOMOSIS  . HERNIA REPAIR     in his 68s   No Known Allergies  Current Outpatient Prescriptions  Medication Sig Dispense Refill  . aspirin 325 MG tablet Take 325 mg by mouth daily.    . benazepril (LOTENSIN) 40 MG tablet Take 20-40 mg by mouth 2 (two) times daily. 1 tablet in am and 1/2 tablet at night    . CARTIA XT 240 MG 24 hr capsule Take 1 capsule by mouth daily.    Marland Kitchen dicyclomine (BENTYL) 10 MG capsule TAKE ONE TO TWO CAPSULES BY MOUTH BEFORE BREAKFAST AND  BEFORE  LUNCH  FOR  DIARRHEA  AS  NEEDED    . doxazosin (CARDURA) 4 MG tablet Take 4 mg by mouth at bedtime.     . dutasteride (AVODART) 0.5 MG capsule Take 0.5 mg by mouth daily.    . furosemide (LASIX) 40 MG tablet Take 40 mg by mouth daily.    . Red Yeast Rice Extract (RED YEAST RICE PO) Take 1 tablet by  mouth once a week.     .      .      .       Review of Systems PER HPI OTHERWISE ALL SYSTEMS ARE NEGATIVE.    Objective:   Physical Exam  Constitutional: He is oriented to person, place, and time. He appears well-developed and well-nourished. No distress.  HENT:  Head: Normocephalic and atraumatic.  Mouth/Throat: Oropharynx is clear and moist. No oropharyngeal exudate.  Eyes: Pupils are equal, round, and reactive to light. No scleral icterus.  Neck: Normal range of motion. Neck supple.  Cardiovascular: Normal rate and normal heart sounds.   Irregular RHYTHM  Pulmonary/Chest: Effort normal and breath sounds normal. No respiratory distress.  Abdominal: Soft. Bowel sounds are normal. He exhibits no distension. There is no tenderness.  Musculoskeletal: He exhibits no edema.  Lymphadenopathy:    He has no cervical adenopathy.  Neurological: He is alert and oriented to person, place, and time.  NO  NEW FOCAL DEFICITS  Psychiatric: He has a normal mood and affect.  Vitals reviewed.     Assessment & Plan:

## 2017-07-20 NOTE — Patient Instructions (Signed)
DRINK WATER TO KEEP YOUR URINE LIGHT YELLOW.  FOLLOW A HIGH FIBER DIET. AVOID ITEMS THAT CAUSE BLOATING & GAS.SEE INFO BELOW.  WHEN YOU CONSUME DAIRY(CHEESE, MILK, ICE CREAM), ADD LACTASE 3 PILLS WITH MEALS UP TO THREE TIMES A DAY.   TAKE DICYCLOMINE 10 MG TABLETS ONE OR TWO 30 MINUTES PRIOR MEALS if needed UP TO THREE TIMES A DAY to prevent diarrhea. IT MAY CAUSE DROWSINESS, DRY EYES/MOUTH, BLURRY VISION, OR DIFFICULTY URINATING.   PLEASE CALL WITH QUESTIONS OR CONCERNS.  FOLLOW UP IN 6 MOS.   High-Fiber Diet A high-fiber diet changes your normal diet to include more whole grains, legumes, fruits, and vegetables. Changes in the diet involve replacing refined carbohydrates with unrefined foods. The calorie level of the diet is essentially unchanged. The Dietary Reference Intake (recommended amount) for adult males is 38 grams per day. For adult females, it is 25 grams per day. Pregnant and lactating women should consume 28 grams of fiber per day.Fiber is the intact part of a plant that is not broken down during digestion. Functional fiber is fiber that has been isolated from the plant to provide a beneficial effect in the body.  PURPOSE  Increase stool bulk.   Ease and regulate bowel movements.   Lower cholesterol.   REDUCE RISK OF COLON CANCER  INDICATIONS THAT YOU NEED MORE FIBER  Constipation and hemorrhoids.   Uncomplicated diverticulosis (intestine condition) and irritable bowel syndrome.   Weight management.   As a protective measure against hardening of the arteries (atherosclerosis), diabetes, and cancer.   GUIDELINES FOR INCREASING FIBER IN THE DIET  Start adding fiber to the diet slowly. A gradual increase of about 5 more grams (2 slices of whole-wheat bread, 2 servings of most fruits or vegetables, or 1 bowl of high-fiber cereal) per day is best. Too rapid an increase in fiber may result in constipation, flatulence, and bloating.   Drink enough water and fluids to  keep your urine clear or pale yellow. Water, juice, or caffeine-free drinks are recommended. Not drinking enough fluid may cause constipation.   Eat a variety of high-fiber foods rather than one type of fiber.   Try to increase your intake of fiber through using high-fiber foods rather than fiber pills or supplements that contain small amounts of fiber.   The goal is to change the types of food eaten. Do not supplement your present diet with high-fiber foods, but replace foods in your present diet.  INCLUDE A VARIETY OF FIBER SOURCES  Replace refined and processed grains with whole grains, canned fruits with fresh fruits, and incorporate other fiber sources. White rice, white breads, and most bakery goods contain little or no fiber.   Brown whole-grain rice, buckwheat oats, and many fruits and vegetables are all good sources of fiber. These include: broccoli, Brussels sprouts, cabbage, cauliflower, beets, sweet potatoes, white potatoes (skin on), carrots, tomatoes, eggplant, squash, berries, fresh fruits, and dried fruits.   Cereals appear to be the richest source of fiber. Cereal fiber is found in whole grains and bran. Bran is the fiber-rich outer coat of cereal grain, which is largely removed in refining. In whole-grain cereals, the bran remains. In breakfast cereals, the largest amount of fiber is found in those with "bran" in their names. The fiber content is sometimes indicated on the label.   You may need to include additional fruits and vegetables each day.   In baking, for 1 cup white flour, you may use the following substitutions:  1 cup whole-wheat flour minus 2 tablespoons.   1/2 cup white flour plus 1/2 cup whole-wheat flour.

## 2017-07-20 NOTE — Progress Notes (Signed)
ON RECALL  °

## 2017-07-20 NOTE — Progress Notes (Signed)
cc'ed to pcp °

## 2017-07-20 NOTE — Assessment & Plan Note (Signed)
CLINICALLY IMPROVED BUT MAY BE EXACERBATED BY LACTOSE INTAKE.  DRINK WATER TO KEEP YOUR URINE LIGHT YELLOW. CONTINUE TO MONITOR SYMPTOMS. CONSIDER ANOTHER COURSE OF CIPRO/FLAGYL BID FOR 5 DAYS.  FOLLOW UP IN 6 MOS.

## 2017-07-20 NOTE — Assessment & Plan Note (Signed)
MOST LIKELY DUE TO LOSS OF LACTOSE TOLERANCE AS HE AGES.   DRINK WATER TO KEEP YOUR URINE LIGHT YELLOW. FOLLOW A HIGH FIBER DIET. AVOID ITEMS THAT CAUSE BLOATING & GAS. WHEN YOU CONSUME DAIRY(CHEESE, MILK, ICE CREAM), ADD LACTASE 3 PILLS WITH MEALS UP TO THREE TIMES A DAY. TAKE DICYCLOMINE 10 MG TABLETS ONE OR TWO 30 MINUTES PRIOR MEALS if needed UP TO THREE TIMES A DAY to prevent diarrhea. IT MAY CAUSE DROWSINESS, DRY EYES/MOUTH, BLURRY VISION, OR DIFFICULTY URINATING. CALL WITH QUESTIONS OR CONCERNS.  FOLLOW UP IN 6 MOS.

## 2017-10-12 ENCOUNTER — Other Ambulatory Visit: Payer: Self-pay

## 2017-10-12 ENCOUNTER — Encounter: Payer: Self-pay | Admitting: Family Medicine

## 2017-10-12 ENCOUNTER — Ambulatory Visit (INDEPENDENT_AMBULATORY_CARE_PROVIDER_SITE_OTHER): Payer: Managed Care, Other (non HMO) | Admitting: Family Medicine

## 2017-10-12 VITALS — BP 112/68 | HR 76 | Temp 98.5°F | Resp 18 | Ht 69.0 in | Wt 171.0 lb

## 2017-10-12 DIAGNOSIS — I10 Essential (primary) hypertension: Secondary | ICD-10-CM | POA: Diagnosis not present

## 2017-10-12 DIAGNOSIS — N183 Chronic kidney disease, stage 3 unspecified: Secondary | ICD-10-CM

## 2017-10-12 DIAGNOSIS — Z85038 Personal history of other malignant neoplasm of large intestine: Secondary | ICD-10-CM | POA: Diagnosis not present

## 2017-10-12 DIAGNOSIS — I4819 Other persistent atrial fibrillation: Secondary | ICD-10-CM

## 2017-10-12 DIAGNOSIS — I481 Persistent atrial fibrillation: Secondary | ICD-10-CM | POA: Diagnosis not present

## 2017-10-12 NOTE — Progress Notes (Signed)
Chief Complaint  Patient presents with  . Hypertension    est care   This is a new patient to evaluate.  His old doctor retired. He is compliant with his medical regimen.  He takes his medication.  He has blood pressure that is well controlled. He has obstructive sleep apnea.  He uses a CPAP. He states he has persistent atrial fibrillation.  He is not under the care of a cardiologist.  He is not on anticoagulant.  He takes aspirin 325 mg a day.  He states that it was recommended to him to be on stronger anticoagulation, and he chooses not to.  I will obtain the records from his old doctor can address this with him at his next visit.  I believe he should see a cardiologist and I believe he should be on anticoagulation. He has chronic kidney disease.  His last GFR was 38.  He is seems unaware that he has any kidney impairment.  He does not take any nonsteroidal anti-inflammatory medications. He has a history of colon cancer.  This was diagnosed in 1997.  He is under the care of [name] in gastroenterology.  He has regular screening.  He had a small portion of his colon removed for a stage I cancer, with no recurrence.  He states that after the surgery he did end up with an umbilical hernia, it is quite large.  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  He likes his job, likes "keeping busy". He does refuse immunizations.  He refuses flu shots, pneumonia shots, tetanus shots, and has not had a shingles shot.    Patient Active Problem List   Diagnosis Date Noted  . Benign prostatic hyperplasia with urinary obstruction 05/22/2017  . Small intestinal bacterial overgrowth 12/25/2014  . Obstructive sleep apnea 09/14/2014  . Persistent atrial fibrillation ([place]) 09/14/2014  . Loose stools 03/05/2014  . History of colon cancer 03/05/2014  . Chronic kidney disease, stage III (moderate) ([place]) 12/26/2012  . Essential  hypertension 12/26/2012  . Colon cancer ([place]) 11/01/1995    Outpatient Encounter Medications as of 10/12/2017  Medication Sig  . aspirin 325 MG tablet Take 325 mg by mouth daily.  . benazepril (LOTENSIN) 40 MG tablet Take by mouth.  . dicyclomine (BENTYL) 10 MG capsule TAKE ONE TO TWO CAPSULES BY MOUTH BEFORE BREAKFAST AND  BEFORE  LUNCH  FOR  DIARRHEA  AS  NEEDED  . dutasteride (AVODART) 0.5 MG capsule Take 0.5 mg by mouth daily.  . furosemide (LASIX) 40 MG tablet Take by mouth.   No facility-administered encounter medications on file as of 10/12/2017.     Past Medical History:  Diagnosis Date  . Cataract   . Chronic kidney disease, stage III (moderate) ([place]) 12/26/2012  . Colon cancer ([place]) 1997   left hemicolectomy   . Heart murmur   . Hypercholesterolemia   . Hypertension   . Obstructive sleep apnea 09/14/2014    Past Surgical History:  Procedure Laterality Date  . BACK SURGERY    . BACTERIAL OVERGROWTH TEST N/A 10/01/2014   POSITIVE FOR SIBO  . COLON SURGERY    . COLONOSCOPY  2004   Dr. Lindalou Hose: normal  . COLONOSCOPY N/A 03/11/2014   NL ILEUM, & COLON Bx, POLYPOID LESIONS, SML IH, NL ANASTOMOSIS  . HERNIA REPAIR     in his 57s  . SPINE SURGERY  Social History   Socioeconomic History  . Marital status: Married    Spouse name: [name]  . Number of children: 5  . Years of education: Not on file  . Highest education level: Associate degree: academic program  Social Needs  . Financial resource strain: Not on file  . Food insecurity - worry: Not on file  . Food insecurity - inability: Not on file  . Transportation needs - medical: Not on file  . Transportation needs - non-medical: Not on file  Occupational History  . Occupation: Best boy  Tobacco Use  . Smoking status: Never Smoker  . Smokeless tobacco: Never Used  . Tobacco comment: Never smoked  Substance and Sexual Activity  . Alcohol use: No  . Drug use: No  . Sexual activity: Not  Currently  Other Topics Concern  . Not on file  Social History Narrative   Lives with wife [name]   Has many children and grands and great grands   Ex wife "still in the neighborhood", with oldest son [name]    Family History  Problem Relation Age of Onset  . Heart disease Mother        died at 19  . COPD Mother   . Alcohol abuse Mother   . Alcohol abuse Father   . Diabetes Brother   . Hypertension Brother   . Kidney disease Brother        dialysis  . Diabetes Brother   . Colon cancer Neg Hx   . Colon polyps Neg Hx     Review of Systems  Constitutional: Negative for chills, fever and weight loss.  HENT: Negative for congestion and hearing loss.        Dentures  Eyes: Negative for blurred vision and pain.       Early cataracts per patient, no visual impairment  Respiratory: Negative for cough and shortness of breath.   Cardiovascular: Negative for chest pain and leg swelling.  Gastrointestinal: Negative for abdominal pain, constipation, diarrhea and heartburn.       Umbilical hernia  Genitourinary: Negative for dysuria and frequency.  Musculoskeletal: Negative for falls, joint pain and myalgias.  Neurological: Negative for dizziness, seizures and headaches.  Psychiatric/Behavioral: Negative for depression. The patient is not nervous/anxious and does not have insomnia.     BP 112/68 (BP Location: Left Arm, Patient Position: Sitting, Cuff Size: Large)   Pulse 76   Temp 98.5 F (36.9 C) (Temporal)   Resp 18   Ht 5\' 9"  (1.753 m)   Wt 171 lb (77.6 kg)   SpO2 100%   BMI 25.25 kg/m   Physical Exam  Constitutional: He is oriented to person, place, and time. He appears well-developed and well-nourished.  HENT:  Head: Normocephalic and atraumatic.  Mouth/Throat: Oropharynx is clear and moist.  Dentures  Eyes: Conjunctivae are normal. Pupils are equal, round, and reactive to light.  Neck: Normal range of motion. Neck supple. No thyromegaly present.  Cardiovascular:  Normal rate, regular rhythm and normal heart sounds.  Pulmonary/Chest: Effort normal and breath sounds normal. No respiratory distress.  Abdominal: Soft. Bowel sounds are normal.  Protuberant, large, umbilical hernia.  Soft  Musculoskeletal: Normal range of motion. He exhibits no edema.  Lymphadenopathy:    He has no cervical adenopathy.  Neurological: He is alert and oriented to person, place, and time.  Gait normal  Skin: Skin is warm and dry.  Psychiatric: He has a normal mood and affect. His behavior is normal. Thought content normal.  Nursing note and vitals reviewed.  ASSESSMENT/PLAN:  1. Essential hypertension Well-controlled - CBC - COMPLETE METABOLIC PANEL WITH GFR - Lipid panel - Urinalysis, Routine w reflex microscopic  2. Persistent atrial fibrillation [facility]) Discussed with patient.  Will review records  3. Chronic kidney disease, stage III (moderate) (HCC) Discussed with patient.  We will repeat labs 4.  History of colon cancer  Patient Instructions  Need lab testing today  Need records Dr Scotty Court  No change in medicines Call for refills  See me in 3 months Need PE next visit   [name], MD

## 2017-10-12 NOTE — Patient Instructions (Signed)
Need lab testing today  Need records Dr Scotty Court  No change in medicines Call for refills  See me in 3 months Need PE next visit

## 2017-10-13 ENCOUNTER — Encounter: Payer: Self-pay | Admitting: Family Medicine

## 2017-10-13 LAB — CBC
HCT: 33.8 % — ABNORMAL LOW (ref 38.5–50.0)
Hemoglobin: 10.2 g/dL — ABNORMAL LOW (ref 13.2–17.1)
MCH: 23.5 pg — AB (ref 27.0–33.0)
MCHC: 30.2 g/dL — ABNORMAL LOW (ref 32.0–36.0)
MCV: 77.9 fL — AB (ref 80.0–100.0)
MPV: 9.9 fL (ref 7.5–12.5)
Platelets: 267 10*3/uL (ref 140–400)
RBC: 4.34 10*6/uL (ref 4.20–5.80)
RDW: 13 % (ref 11.0–15.0)
WBC: 7.4 10*3/uL (ref 3.8–10.8)

## 2017-10-13 LAB — COMPLETE METABOLIC PANEL WITH GFR
AG RATIO: 1.4 (calc) (ref 1.0–2.5)
ALT: 12 U/L (ref 9–46)
AST: 16 U/L (ref 10–35)
Albumin: 3.6 g/dL (ref 3.6–5.1)
Alkaline phosphatase (APISO): 94 U/L (ref 40–115)
BUN / CREAT RATIO: 11 (calc) (ref 6–22)
BUN: 19 mg/dL (ref 7–25)
CO2: 26 mmol/L (ref 20–32)
Calcium: 9 mg/dL (ref 8.6–10.3)
Chloride: 107 mmol/L (ref 98–110)
Creat: 1.67 mg/dL — ABNORMAL HIGH (ref 0.70–1.18)
GFR, EST AFRICAN AMERICAN: 47 mL/min/{1.73_m2} — AB (ref >=60)
GFR, EST NON AFRICAN AMERICAN: 40 mL/min/{1.73_m2} — AB (ref >=60)
GLOBULIN: 2.5 g/dL (ref 1.9–3.7)
Glucose, Bld: 85 mg/dL (ref 65–139)
Potassium: 4.1 mmol/L (ref 3.5–5.3)
SODIUM: 142 mmol/L (ref 135–146)
Total Bilirubin: 0.5 mg/dL (ref 0.2–1.2)
Total Protein: 6.1 g/dL (ref 6.1–8.1)

## 2017-10-13 LAB — URINALYSIS, ROUTINE W REFLEX MICROSCOPIC
Bacteria, UA: NONE SEEN /HPF
Bilirubin Urine: NEGATIVE
Glucose, UA: NEGATIVE
Hyaline Cast: NONE SEEN /LPF
Ketones, ur: NEGATIVE
NITRITE: NEGATIVE
Specific Gravity, Urine: 1.013 (ref 1.001–1.03)
pH: <=5 (ref 5.0–8.0)

## 2017-10-13 LAB — LIPID PANEL
Cholesterol: 149 mg/dL (ref <200)
HDL: 66 mg/dL (ref >40)
LDL CHOLESTEROL (CALC): 69 mg/dL
NON-HDL CHOLESTEROL (CALC): 83 mg/dL (ref <130)
TRIGLYCERIDES: 55 mg/dL (ref <150)
Total CHOL/HDL Ratio: 2.3 (calc) (ref <5.0)

## 2017-11-15 ENCOUNTER — Encounter: Payer: Self-pay | Admitting: Family Medicine

## 2017-11-15 DIAGNOSIS — R310 Gross hematuria: Secondary | ICD-10-CM | POA: Insufficient documentation

## 2017-11-23 ENCOUNTER — Encounter: Payer: Self-pay | Admitting: Gastroenterology

## 2017-12-05 ENCOUNTER — Encounter: Payer: Self-pay | Admitting: Family Medicine

## 2017-12-05 ENCOUNTER — Other Ambulatory Visit: Payer: Self-pay

## 2017-12-05 ENCOUNTER — Ambulatory Visit (INDEPENDENT_AMBULATORY_CARE_PROVIDER_SITE_OTHER): Payer: Managed Care, Other (non HMO) | Admitting: Family Medicine

## 2017-12-05 VITALS — BP 136/86 | HR 80 | Temp 97.8°F | Resp 16 | Ht 69.0 in | Wt 162.1 lb

## 2017-12-05 DIAGNOSIS — N3091 Cystitis, unspecified with hematuria: Secondary | ICD-10-CM

## 2017-12-05 LAB — POCT URINALYSIS DIPSTICK
BILIRUBIN UA: NEGATIVE
Glucose, UA: NEGATIVE
KETONES UA: NEGATIVE
Nitrite, UA: NEGATIVE
Protein, UA: >=300
SPEC GRAV UA: 1.025 (ref 1.010–1.025)
Urobilinogen, UA: 1 E.U./dL
pH, UA: 5.5 (ref 5.0–8.0)

## 2017-12-05 MED ORDER — CIPROFLOXACIN HCL 500 MG PO TABS: 500.0000 mg | ORAL_TABLET | Freq: Two times a day (BID) | ORAL | 0 refills | Status: DC

## 2017-12-05 MED ORDER — PHENAZOPYRIDINE HCL 100 MG PO TABS: 100.0000 mg | ORAL_TABLET | Freq: Three times a day (TID) | ORAL | 0 refills | Status: DC | PRN

## 2017-12-05 NOTE — Progress Notes (Signed)
Chief Complaint  Patient presents with  . Urinary Tract Infection   Patient is here for symptoms of a urinary tract infection.  He has urinary frequency and burning with urination.  He has a history of urinary infections from urinary retention.  He has known benign prostatic hypertrophy with urinary obstruction and retained urine.  He states that he went to his urologist a few days ago.  They did a urinalysis and culture.  The culture grew less than 10,000, colonies per mL unknown species.  They did not treat him with any medication.  Since then he states that the burning is worse.  He stopped drinking water because the burning is so bad when he tries to urinate.  He is not having any urinary/penile discharge.  He is not sexually active.  He has not had any fever or chills.  No nausea or vomiting.  No flank pain.  He does have mild pressure and pain in his suprapubic region.  He is compliant with daily Avodart.  He has nocturia x1/2.  Patient Active Problem List   Diagnosis Date Noted  . Hematuria, gross 11/15/2017  . Benign prostatic hyperplasia with urinary obstruction 05/22/2017  . Small intestinal bacterial overgrowth 12/25/2014  . Obstructive sleep apnea 09/14/2014  . Persistent atrial fibrillation ([place]) 09/14/2014  . Loose stools 03/05/2014  . History of colon cancer 03/05/2014  . Chronic kidney disease, stage III (moderate) ([place]) 12/26/2012  . Essential hypertension 12/26/2012  . Colon cancer ([place]) 11/01/1995    Outpatient Encounter Medications as of 12/05/2017  Medication Sig  . aspirin 325 MG tablet Take 325 mg by mouth daily.  . benazepril (LOTENSIN) 40 MG tablet Take by mouth.  . dicyclomine (BENTYL) 10 MG capsule TAKE ONE TO TWO CAPSULES BY MOUTH BEFORE BREAKFAST AND  BEFORE  LUNCH  FOR  DIARRHEA  AS  NEEDED  . diltiazem (CARDIZEM CD) 240 MG 24 hr capsule Take 240 mg by mouth.  . dutasteride (AVODART) 0.5 MG capsule Take 0.5 mg by mouth daily.  . furosemide (LASIX) 40 MG  tablet Take by mouth.  . ciprofloxacin (CIPRO) 500 MG tablet Take 1 tablet (500 mg total) by mouth 2 (two) times daily.  . phenazopyridine (PYRIDIUM) 100 MG tablet Take 1 tablet (100 mg total) by mouth 3 (three) times daily as needed for pain.   No facility-administered encounter medications on file as of 12/05/2017.     No Known Allergies  Review of Systems  Constitutional: Negative for activity change, appetite change, chills and fever.  HENT: Negative for congestion and dental problem.   Eyes: Negative for photophobia and visual disturbance.  Respiratory: Negative for cough and shortness of breath.   Cardiovascular: Negative for chest pain and palpitations.  Gastrointestinal: Positive for abdominal pain. Negative for nausea and vomiting.  Genitourinary: Positive for dysuria, frequency and penile pain. Negative for discharge, flank pain and hematuria.  Musculoskeletal: Negative for arthralgias and back pain.  Neurological: Negative for dizziness and facial asymmetry.  Psychiatric/Behavioral: Negative for sleep disturbance. The patient is not nervous/anxious.      BP 136/86 (BP Location: Left Arm, Patient Position: Sitting, Cuff Size: Normal)   Pulse 80   Temp 97.8 F (36.6 C) (Temporal)   Resp 16   Ht 5\' 9"  (1.753 m)   Wt 162 lb 1.9 oz (73.5 kg)   SpO2 100%   BMI 23.94 kg/m   Physical Exam  Constitutional: He appears well-developed and well-nourished. No distress.  HENT:  Head: Normocephalic  and atraumatic.  Mouth/Throat: Oropharynx is clear and moist.  Eyes: Conjunctivae are normal. Pupils are equal, round, and reactive to light.  Cardiovascular: Normal rate, regular rhythm and normal heart sounds.  Pulmonary/Chest: Effort normal and breath sounds normal.  Abdominal: Soft. Bowel sounds are normal. There is no tenderness.  No flank tenderness  Musculoskeletal: Normal range of motion. He exhibits no edema.  Psychiatric: He has a normal mood and affect. His behavior is  normal.  Quiet.  Appears moderately uncomfortable   Results for orders placed or performed in visit on 12/05/17  POCT urinalysis dipstick  Result Value Ref Range   Color, UA yellow    Clarity, UA cloudy    Glucose, UA neg    Bilirubin, UA neg    Ketones, UA neg    Spec Grav, UA 1.025 1.010 - 1.025   Blood, UA large    pH, UA 5.5 5.0 - 8.0   Protein, UA >=300    Urobilinogen, UA 1.0 0.2 or 1.0 E.U./dL   Nitrite, UA neg    Leukocytes, UA Small (1+) (A) Negative   Appearance     Odor       ASSESSMENT/PLAN:  1. Cystitis with hematuria Discussed with patient that waiting to treat with an antibiotic until the culture is back would be appropriate.  I am going to give him Pyridium for his discomfort.  He is given Cipro to take twice a day.  He does not want to wait until culture report because his discomfort is so severe.  We will call him in a couple days to see how he is feeling. - POCT urinalysis dipstick - Urine Culture   Patient Instructions  Take the cipro twice a day Take the pyridium as needed for pain Pyridium makes urine look orange Call if not improving in a few days   [name], MD

## 2017-12-05 NOTE — Patient Instructions (Signed)
Take the cipro twice a day Take the pyridium as needed for pain Pyridium makes urine look orange Call if not improving in a few days

## 2017-12-08 ENCOUNTER — Telehealth: Payer: Self-pay | Admitting: Family Medicine

## 2017-12-08 ENCOUNTER — Emergency Department (HOSPITAL_COMMUNITY)
Admission: EM | Admit: 2017-12-08 | Discharge: 2017-12-08 | Disposition: A | Discharge status: Home / Self Care | Payer: Managed Care, Other (non HMO) | Attending: Emergency Medicine | Admitting: Emergency Medicine

## 2017-12-08 ENCOUNTER — Encounter (HOSPITAL_COMMUNITY): Payer: Self-pay | Admitting: *Deleted

## 2017-12-08 ENCOUNTER — Other Ambulatory Visit: Payer: Self-pay

## 2017-12-08 DIAGNOSIS — I129 Hypertensive chronic kidney disease with stage 1 through stage 4 chronic kidney disease, or unspecified chronic kidney disease: Secondary | ICD-10-CM | POA: Diagnosis not present

## 2017-12-08 DIAGNOSIS — N390 Urinary tract infection, site not specified: Secondary | ICD-10-CM | POA: Diagnosis not present

## 2017-12-08 DIAGNOSIS — R300 Dysuria: Secondary | ICD-10-CM | POA: Diagnosis present

## 2017-12-08 DIAGNOSIS — N183 Chronic kidney disease, stage 3 (moderate): Secondary | ICD-10-CM | POA: Insufficient documentation

## 2017-12-08 DIAGNOSIS — Z79899 Other long term (current) drug therapy: Secondary | ICD-10-CM | POA: Diagnosis not present

## 2017-12-08 DIAGNOSIS — Z85038 Personal history of other malignant neoplasm of large intestine: Secondary | ICD-10-CM | POA: Insufficient documentation

## 2017-12-08 DIAGNOSIS — Z7982 Long term (current) use of aspirin: Secondary | ICD-10-CM | POA: Diagnosis not present

## 2017-12-08 LAB — CBC WITH DIFFERENTIAL/PLATELET
Basophils Absolute: 0 10*3/uL (ref 0.0–0.1)
Basophils Relative: 0 %
Eosinophils Absolute: 0.1 10*3/uL (ref 0.0–0.7)
Eosinophils Relative: 1 %
HEMATOCRIT: 31.1 % — AB (ref 39.0–52.0)
HEMOGLOBIN: 9.4 g/dL — AB (ref 13.0–17.0)
LYMPHS ABS: 2.9 10*3/uL (ref 0.7–4.0)
LYMPHS PCT: 29 %
MCH: 23.3 pg — AB (ref 26.0–34.0)
MCHC: 30.2 g/dL (ref 30.0–36.0)
MCV: 77.2 fL — ABNORMAL LOW (ref 78.0–100.0)
MONOS PCT: 12 %
Monocytes Absolute: 1.2 10*3/uL — ABNORMAL HIGH (ref 0.1–1.0)
NEUTROS ABS: 5.9 10*3/uL (ref 1.7–7.7)
NEUTROS PCT: 58 %
Platelets: 280 10*3/uL (ref 150–400)
RBC: 4.03 MIL/uL — AB (ref 4.22–5.81)
RDW: 13.9 % (ref 11.5–15.5)
WBC: 10.1 10*3/uL (ref 4.0–10.5)

## 2017-12-08 LAB — BASIC METABOLIC PANEL
ANION GAP: 9 (ref 5–15)
BUN: 18 mg/dL (ref 6–20)
CO2: 26 mmol/L (ref 22–32)
Calcium: 9.3 mg/dL (ref 8.9–10.3)
Chloride: 104 mmol/L (ref 101–111)
Creatinine, Ser: 1.58 mg/dL — ABNORMAL HIGH (ref 0.61–1.24)
GFR calc Af Amer: 49 mL/min — ABNORMAL LOW (ref >60)
GFR, EST NON AFRICAN AMERICAN: 42 mL/min — AB (ref >60)
GLUCOSE: 118 mg/dL — AB (ref 65–99)
POTASSIUM: 4.3 mmol/L (ref 3.5–5.1)
SODIUM: 139 mmol/L (ref 135–145)

## 2017-12-08 LAB — URINALYSIS, ROUTINE W REFLEX MICROSCOPIC
Bilirubin Urine: NEGATIVE
Glucose, UA: NEGATIVE mg/dL
Ketones, ur: NEGATIVE mg/dL
Nitrite: POSITIVE — AB
PROTEIN: 100 mg/dL — AB
Specific Gravity, Urine: 1.016 (ref 1.005–1.030)
pH: 5 (ref 5.0–8.0)

## 2017-12-08 LAB — URINE CULTURE
MICRO NUMBER:: 90155764
SPECIMEN QUALITY: ADEQUATE

## 2017-12-08 MED ORDER — KETOROLAC TROMETHAMINE 30 MG/ML IJ SOLN: 15.0000 mg | Freq: Once | INTRAMUSCULAR | Status: DC

## 2017-12-08 MED ORDER — CEPHALEXIN 500 MG PO CAPS: 500.0000 mg | ORAL_CAPSULE | Freq: Two times a day (BID) | ORAL | 0 refills | Status: AC

## 2017-12-08 MED ORDER — TRAMADOL HCL 50 MG PO TABS: 50.0000 mg | ORAL_TABLET | Freq: Four times a day (QID) | ORAL | 0 refills | Status: DC | PRN

## 2017-12-08 MED ORDER — CEPHALEXIN 500 MG PO CAPS
500.0000 mg | ORAL_CAPSULE | Freq: Once | ORAL | Status: AC
  Administered 2017-12-08: 500 mg via ORAL
  Filled 2017-12-08: qty 1

## 2017-12-08 MED ORDER — TRAMADOL HCL 50 MG PO TABS
50.0000 mg | ORAL_TABLET | Freq: Once | ORAL | Status: AC
  Administered 2017-12-08: 50 mg via ORAL
  Filled 2017-12-08: qty 1

## 2017-12-08 NOTE — Discharge Instructions (Signed)
As discussed, your evaluation today has been largely reassuring.  But, it is important that you monitor your condition carefully, and do not hesitate to return to the ED if you develop new, or concerning changes in your condition.  You have been diagnosed with a urinary tract infection, which is seemingly resistant to your initial antibiotic.  Please follow-up with your physician for appropriate ongoing care.

## 2017-12-08 NOTE — Telephone Encounter (Signed)
Patient informed of message below, verbalized understanding.  

## 2017-12-08 NOTE — Telephone Encounter (Signed)
Please call patient and advised that his symptoms should not be this great with a simple urinary tract infection.  And his symptoms should have somewhat subsided with 3 days of the Cipro.  I have reviewed his urine culture and his urine infection was sensitive to the Cipro.  Due to the severity of his pain and the fact that it has not gotten any better, I would recommend that he go to the emergency department for evaluation.  My concern at this time is that with his history of urinary retention and other urologic issues, he could have an issue with his bladder/urinary retention at this time which is causing his symptoms.  Please advise him to go to the emergency department.

## 2017-12-08 NOTE — ED Triage Notes (Signed)
Recently treated for uti, thinks meds did not work

## 2017-12-08 NOTE — Telephone Encounter (Signed)
The patient is calling in --has taken all the Cipro, and states his pain is a 5--needs more medicine, still hurting when he goes to the bathroom and has pain in the lower stomach..[phone]

## 2017-12-08 NOTE — Telephone Encounter (Signed)
Is there anything you can advise? 

## 2017-12-08 NOTE — ED Provider Notes (Signed)
[facility] EMERGENCY DEPARTMENT Provider Note   CSN: [id number] Arrival date & time: 12/08/17  1553     History   Chief Complaint Chief Complaint  Patient presents with  . Dysuria    HPI Benjamin Chaney is a 72 y.o. male.  HPI Presents with concern of dysuria. He notes that over the past 3 or 4 days he has had pain with urination, which persists for up to 15 minutes after each episode. Pain is focally in the suprapubic region, sore, severe. Patient started medication earlier this week due to presumed UTI, ciprofloxacin. In spite of taking this and Pyridium, he has persistent symptoms. No new fever, chills, nausea, vomiting or other new complaints per Acknowledges a history of prostate issues, and saw his urologist 1 week ago.  Past Medical History:  Diagnosis Date  . Cataract   . Chronic kidney disease, stage III (moderate) ([place]) 12/26/2012  . Colon cancer ([place]) 1997   left hemicolectomy   . Heart murmur   . Hypercholesterolemia   . Hypertension   . Obstructive sleep apnea 09/14/2014    Patient Active Problem List   Diagnosis Date Noted  . Hematuria, gross 11/15/2017  . Benign prostatic hyperplasia with urinary obstruction 05/22/2017  . Small intestinal bacterial overgrowth 12/25/2014  . Obstructive sleep apnea 09/14/2014  . Persistent atrial fibrillation ([place]) 09/14/2014  . Loose stools 03/05/2014  . History of colon cancer 03/05/2014  . Chronic kidney disease, stage III (moderate) ([place]) 12/26/2012  . Essential hypertension 12/26/2012  . Colon cancer ([place]) 11/01/1995    Past Surgical History:  Procedure Laterality Date  . BACK SURGERY    . BACTERIAL OVERGROWTH TEST N/A 10/01/2014   POSITIVE FOR SIBO  . COLON SURGERY    . COLONOSCOPY  2004   Dr. Lindalou Hose: normal  . COLONOSCOPY N/A 03/11/2014   NL ILEUM, & COLON Bx, POLYPOID LESIONS, SML IH, NL ANASTOMOSIS  . HERNIA REPAIR     in his 35s  . SPINE SURGERY         Home Medications    Prior to  Admission medications   Medication Sig Start Date End Date Taking? Authorizing Provider  aspirin 325 MG tablet Take 325 mg by mouth daily.   Yes [provider]  benazepril (LOTENSIN) 40 MG tablet Take 40 mg by mouth daily.  02/07/15  Yes [provider]  ciprofloxacin (CIPRO) 500 MG tablet Take 1 tablet (500 mg total) by mouth 2 (two) times daily. 12/05/17  Yes [name], MD  dicyclomine (BENTYL) 10 MG capsule TAKE ONE TO TWO CAPSULES BY MOUTH BEFORE BREAKFAST AND  BEFORE  LUNCH  FOR  DIARRHEA  AS  NEEDED 06/06/17  Yes Annitta Needs, NP  diltiazem (CARDIZEM CD) 240 MG 24 hr capsule Take 240 mg by mouth daily.    Yes [provider]  dutasteride (AVODART) 0.5 MG capsule Take 0.5 mg by mouth daily.   Yes [provider]  furosemide (LASIX) 40 MG tablet Take 40 mg by mouth daily.  12/25/12  Yes [provider]  phenazopyridine (PYRIDIUM) 100 MG tablet Take 1 tablet (100 mg total) by mouth 3 (three) times daily as needed for pain. 12/05/17  Yes [name], MD    Family History Family History  Problem Relation Age of Onset  . Heart disease Mother        died at 57  . COPD Mother   . Alcohol abuse Mother   . Alcohol abuse Father   .  Diabetes Brother   . Hypertension Brother   . Kidney disease Brother        dialysis  . Diabetes Brother   . Colon cancer Neg Hx   . Colon polyps Neg Hx     Social History Social History   Tobacco Use  . Smoking status: Never Smoker  . Smokeless tobacco: Never Used  . Tobacco comment: Never smoked  Substance Use Topics  . Alcohol use: No  . Drug use: No     Allergies   Patient has no known allergies.   Review of Systems Review of Systems  Constitutional:       Per HPI, otherwise negative  HENT:       Per HPI, otherwise negative  Respiratory:       Per HPI, otherwise negative  Cardiovascular:       Per HPI, otherwise negative  Gastrointestinal: Negative for vomiting.  Endocrine:        Negative aside from HPI  Genitourinary:       Neg aside from HPI   Musculoskeletal:       Per HPI, otherwise negative  Skin: Negative.   Neurological: Negative for syncope.     Physical Exam Updated Vital Signs BP (!) 155/98   Pulse (!) 110   Temp 98.6 F (37 C)   Resp 20   Ht 5\' 9"  (1.753 m)   Wt 72.6 kg (160 lb)   SpO2 100%   BMI 23.63 kg/m   Physical Exam  Constitutional: He is oriented to person, place, and time. He appears well-developed. No distress.  HENT:  Head: Normocephalic and atraumatic.  Eyes: Conjunctivae and EOM are normal.  Cardiovascular: Normal rate and regular rhythm.  Pulmonary/Chest: Effort normal. No stridor. No respiratory distress.  Abdominal: He exhibits no distension.  Mild tenderness about the suprapubic region, otherwise unremarkable abdominal exam  Musculoskeletal: He exhibits no edema.  Neurological: He is alert and oriented to person, place, and time.  Skin: Skin is warm and dry.  Psychiatric: He has a normal mood and affect.  Nursing note and vitals reviewed.    ED Treatments / Results  Labs (all labs ordered are listed, but only abnormal results are displayed) Labs Reviewed  URINALYSIS, ROUTINE W REFLEX MICROSCOPIC - Abnormal; Notable for the following components:      Result Value   Color, Urine AMBER (*)    APPearance HAZY (*)    Hgb urine dipstick MODERATE (*)    Protein, ur 100 (*)    Nitrite POSITIVE (*)    Leukocytes, UA MODERATE (*)    Bacteria, UA RARE (*)    Squamous Epithelial / LPF 0-5 (*)    All other components within normal limits  BASIC METABOLIC PANEL - Abnormal; Notable for the following components:   Glucose, Bld 118 (*)    Creatinine, Ser 1.58 (*)    GFR calc non Af Amer 42 (*)    GFR calc Af Amer 49 (*)    All other components within normal limits  CBC WITH DIFFERENTIAL/PLATELET - Abnormal; Notable for the following components:   RBC 4.03 (*)    Hemoglobin 9.4 (*)    HCT 31.1 (*)    MCV 77.2 (*)     MCH 23.3 (*)    Monocytes Absolute 1.2 (*)    All other components within normal limits    EKG  EKG Interpretation None       Radiology No results found.  Procedures Procedures (including critical care  time)  Medications Ordered in ED Medications  cephALEXin (KEFLEX) capsule 500 mg (not administered)  traMADol (ULTRAM) tablet 50 mg (50 mg Oral Given 12/08/17 1645)     Initial Impression / Assessment and Plan / ED Course  I have reviewed the triage vital signs and the nursing notes.  Pertinent labs & imaging results that were available during my care of the patient were reviewed by me and considered in my medical decision making (see chart for details).     6:00 PM On repeat exam the patient is awake and alert. I discussed lab findings with him, including evidence for persistent UTI. With suspicion for resistant infection, but no evidence for bacteremia or sepsis, no distress, the patient will be started on a new antibiotic regimen, received analgesia, as he notes his pain is improved as well with tramadol, and he will follow-up with his urologist.  Final Clinical Impressions(s) / ED Diagnoses  UTI   Carmin Muskrat, MD 12/08/17 1800

## 2017-12-19 ENCOUNTER — Encounter: Payer: Self-pay | Admitting: *Deleted

## 2017-12-19 ENCOUNTER — Ambulatory Visit (INDEPENDENT_AMBULATORY_CARE_PROVIDER_SITE_OTHER): Payer: Managed Care, Other (non HMO) | Admitting: Gastroenterology

## 2017-12-19 ENCOUNTER — Encounter: Payer: Self-pay | Admitting: Gastroenterology

## 2017-12-19 ENCOUNTER — Telehealth: Payer: Self-pay | Admitting: *Deleted

## 2017-12-19 ENCOUNTER — Other Ambulatory Visit: Payer: Self-pay | Admitting: *Deleted

## 2017-12-19 VITALS — BP 110/72 | HR 84 | Temp 98.6°F | Ht 69.0 in | Wt 159.0 lb

## 2017-12-19 DIAGNOSIS — Z85038 Personal history of other malignant neoplasm of large intestine: Secondary | ICD-10-CM

## 2017-12-19 DIAGNOSIS — R195 Other fecal abnormalities: Secondary | ICD-10-CM

## 2017-12-19 DIAGNOSIS — R634 Abnormal weight loss: Secondary | ICD-10-CM

## 2017-12-19 DIAGNOSIS — D509 Iron deficiency anemia, unspecified: Secondary | ICD-10-CM

## 2017-12-19 HISTORY — DX: Iron deficiency anemia, unspecified: D50.9

## 2017-12-19 MED ORDER — NA SULFATE-K SULFATE-MG SULF 17.5-3.13-1.6 GM/177ML PO SOLN: 1.0000 | ORAL | 0 refills | Status: DC

## 2017-12-19 NOTE — Assessment & Plan Note (Signed)
Early interval colonoscopy now due to worsening anemia, loss of appetite, and documented weight loss.

## 2017-12-19 NOTE — Assessment & Plan Note (Signed)
72 year old male with new onset lack of appetite, early satiety, and documented weight loss when compared to baseline weights historically. He has had significant decline since last seen in September of 16 lbs, weighing 159 today. Last year at this time, he weighed 184. No overt GI bleeding, abdominal pain, dysphagia, nausea, or vomiting. History significant for colon cancer in remote past, with last colonoscopy in May 2015. No prior EGD. I note that he has drifting Hgb with worsening microcytic anemia from baseline. Concern for occult malignancy at this time. At time of office visit, patient was quite worried and soft-spoken. He is concerned something is significantly abnormal and nervous about the unknown. Reassured we would expedite the evaluation to ensure any GI etiology had been ruled out.   Check iron studies, add TSH Proceed with colonoscopy/EGD with [name] in the near future. The risks, benefits, and alternatives have been discussed in detail with the patient. They state understanding and desire to proceed.  May consider imaging as well if no evidence of IDA on labs: however, if evidence of IDA, pursue endoscopic evaluations first with possible imaging if inconclusive.

## 2017-12-19 NOTE — Telephone Encounter (Signed)
LMOVM. TCS/EGD W/ SLF scheduled for 01/02/18 at 2:00pm. Instructions for prep mailed to pt, prep sent into the pharmacy.

## 2017-12-19 NOTE — Telephone Encounter (Signed)
Spoke with pt and is aware of appt details. Discussed instructions with pt over the phone and is also aware these were mailed to him. Nothing further needed

## 2017-12-19 NOTE — Assessment & Plan Note (Signed)
At baseline for patient. Not taking Bentyl. Stress seems to exacerbate. Known history of SIBO. No changes to current medications.

## 2017-12-19 NOTE — Progress Notes (Signed)
CC'ED TO PCP 

## 2017-12-19 NOTE — Progress Notes (Addendum)
REVIEWED. WEIGHT LOSS DUE TO BLADDER CANCER DIAGNOSED APR 2019. WILL RE-ASSES BENEFITS V. RISKS OF ENDOSCOPY AFTER BLADDER CANCER DEFINITIVELY MANAGED.  Referring Provider: [name], MD Primary Care Physician:  [name], MD Primary GI: [name]   Chief Complaint  Patient presents with  . Anorexia    x 1 month  . loss of weight    HPI:   Benjamin Chaney is a 72 y.o. male presenting today with a history of chronic diarrhea, history of SIBO, history of colon cancer in the remote past with last colonoscopy May 2015 with normal ileum and colonic biopsies, polypoid lesions, small internal hemorrhoids, normal anastomosis. Next colonoscopy due in 2020. States he called himself to make an appointment due to weight loss and lack of appetite.   Recent labs with drifting Hgb down from baseline. Historically has been in the 10/11 range. Unknown iron studies. Notes decrease in appetite a few months ago. No pain. No N/V. No rectal bleeding. No dysphagia. Notes early satiety. States his bowel habits are "normal" for him right now. 2-3 loose BMs a day. States when his wife's son is around the house, diarrhea is worse. Waxes and wanes. Has not been taking Bentyl recently. States since Christmas he has been better.   Weight today is 159. Feb 2018 was 184. He has had a significant decline since December of this year.   Past Medical History:  Diagnosis Date  . Cataract   . Chronic kidney disease, stage III (moderate) ([place]) 12/26/2012  . Colon cancer ([place]) 1997   left hemicolectomy   . Heart murmur   . Hypercholesterolemia   . Hypertension   . Microcytic anemia 12/19/2017  . Obstructive sleep apnea 09/14/2014    Past Surgical History:  Procedure Laterality Date  . BACK SURGERY    . BACTERIAL OVERGROWTH TEST N/A 10/01/2014   POSITIVE FOR SIBO  . COLON SURGERY    . COLONOSCOPY  2004   Dr. Lindalou Hose: normal  . COLONOSCOPY N/A 03/11/2014   NL ILEUM, & COLON Bx, POLYPOID LESIONS,  SML IH, NL ANASTOMOSIS  . HERNIA REPAIR     in his 24s  . SPINE SURGERY      Current Outpatient Medications  Medication Sig Dispense Refill  . aspirin 325 MG tablet Take 325 mg by mouth daily.    . benazepril (LOTENSIN) 40 MG tablet Take 40 mg by mouth daily.     Marland Kitchen dicyclomine (BENTYL) 10 MG capsule TAKE ONE TO TWO CAPSULES BY MOUTH BEFORE BREAKFAST AND  BEFORE  LUNCH  FOR  DIARRHEA  AS  NEEDED 90 capsule 5  . diltiazem (CARDIZEM CD) 240 MG 24 hr capsule Take 240 mg by mouth daily.     Marland Kitchen dutasteride (AVODART) 0.5 MG capsule Take 0.5 mg by mouth daily.    . furosemide (LASIX) 40 MG tablet Take 40 mg by mouth daily.     . traMADol (ULTRAM) 50 MG tablet Take 1 tablet (50 mg total) by mouth every 6 (six) hours as needed. 15 tablet 0  . Na Sulfate-K Sulfate-Mg Sulf 17.5-3.13-1.6 GM/177ML SOLN Take 1 kit by mouth as directed. 1 Bottle 0   No current facility-administered medications for this visit.     Allergies as of 12/19/2017  . (No Known Allergies)    Family History  Problem Relation Age of Onset  . Heart disease Mother        died at 76  . COPD Mother   . Alcohol abuse Mother   .  Alcohol abuse Father   . Diabetes Brother   . Hypertension Brother   . Kidney disease Brother        dialysis  . Diabetes Brother   . Colon cancer Neg Hx   . Colon polyps Neg Hx     Social History   Socioeconomic History  . Marital status: Married    Spouse name: [name]  . Number of children: 5  . Years of education: None  . Highest education level: Associate degree: academic program  Social Needs  . Financial resource strain: None  . Food insecurity - worry: None  . Food insecurity - inability: None  . Transportation needs - medical: None  . Transportation needs - non-medical: None  Occupational History  . Occupation: Best boy  Tobacco Use  . Smoking status: Never Smoker  . Smokeless tobacco: Never Used  . Tobacco comment: Never smoked  Substance and Sexual Activity    . Alcohol use: No  . Drug use: No  . Sexual activity: Not Currently  Other Topics Concern  . None  Social History Narrative   Lives with wife [name]   Has many children and grands and great grands   Ex wife "still in the neighborhood", with oldest son [name]    Review of Systems: Gen: see HPI  CV: Denies chest pain, palpitations, syncope, peripheral edema, and claudication. Resp: Denies dyspnea at rest, cough, wheezing, coughing up blood, and pleurisy. GI: see HPI  Derm: Denies rash, itching, dry skin Psych: Denies depression, anxiety, memory loss, confusion. No homicidal or suicidal ideation.  Heme: Denies bruising, bleeding, and enlarged lymph nodes.  Physical Exam: BP 110/72   Pulse 84   Temp 98.6 F (37 C) (Oral)   Ht _0  (1.753 m)   Wt 159 lb (72.1 kg)   BMI 23.48 kg/m  General:   Alert and oriented. Worried. Pleasant and cooperative.  Head:  Normocephalic and atraumatic. Eyes:  Conjuctiva clear without scleral icterus. Mouth:  Oral mucosa pink and moist.  Abdomen:  +BS, soft, non-tender and non-distended. No rebound or guarding. Large midline presumed ventral hernia, easily reducible. No HSM Msk:  Symmetrical without gross deformities. Normal posture. Extremities:  Without edema. Neurologic:  Alert and  oriented x4 Psych:  Alert and cooperative. Normal mood and affect.

## 2017-12-19 NOTE — Assessment & Plan Note (Signed)
Check iron studies now. TCS/EGD as planned. Likely multifactorial in setting of chronic kidney disease, but he does have worsening anemia from baseline. No overt GI bleeding.

## 2017-12-19 NOTE — Patient Instructions (Signed)
I would like for you to complete blood work today.   I have set you up for a colonoscopy and upper endoscopy with [name] in the near future. We may need to do imaging, but I will await blood work.   Further recommendations to follow! You can add boost or ensure twice a day as supplemental snack.   It was a pleasure to see you today. I strive to create trusting relationships with patients to provide genuine, compassionate, and quality care. I value your feedback. If you receive a survey regarding your visit,  I greatly appreciate you the taking time to fill this out.   Annitta Needs, PhD, ANP-BC [facility]

## 2017-12-20 LAB — IRON,TIBC AND FERRITIN PANEL
%SAT: 8 % — AB (ref 15–60)
FERRITIN: 483 ng/mL — AB (ref 20–380)
Iron: 18 ug/dL — ABNORMAL LOW (ref 50–180)
TIBC: 214 ug/dL — AB (ref 250–425)

## 2017-12-20 LAB — TSH: TSH: 1.79 m[IU]/L (ref 0.40–4.50)

## 2017-12-25 ENCOUNTER — Telehealth: Payer: Self-pay | Admitting: Family Medicine

## 2017-12-25 NOTE — Telephone Encounter (Signed)
Please call the pt he is still having the same issue, and the Urologist appt is 3 weeks away

## 2017-12-26 ENCOUNTER — Emergency Department (HOSPITAL_COMMUNITY)
Admission: EM | Admit: 2017-12-26 | Discharge: 2017-12-26 | Disposition: A | Discharge status: Home / Self Care | Payer: Managed Care, Other (non HMO) | Attending: Emergency Medicine | Admitting: Emergency Medicine

## 2017-12-26 ENCOUNTER — Encounter (HOSPITAL_COMMUNITY): Payer: Self-pay | Admitting: Emergency Medicine

## 2017-12-26 ENCOUNTER — Emergency Department (HOSPITAL_COMMUNITY): Payer: Managed Care, Other (non HMO)

## 2017-12-26 ENCOUNTER — Other Ambulatory Visit: Payer: Self-pay

## 2017-12-26 ENCOUNTER — Telehealth: Payer: Self-pay | Admitting: Gastroenterology

## 2017-12-26 DIAGNOSIS — I129 Hypertensive chronic kidney disease with stage 1 through stage 4 chronic kidney disease, or unspecified chronic kidney disease: Secondary | ICD-10-CM | POA: Insufficient documentation

## 2017-12-26 DIAGNOSIS — N183 Chronic kidney disease, stage 3 (moderate): Secondary | ICD-10-CM | POA: Insufficient documentation

## 2017-12-26 DIAGNOSIS — Z85038 Personal history of other malignant neoplasm of large intestine: Secondary | ICD-10-CM | POA: Diagnosis not present

## 2017-12-26 DIAGNOSIS — N390 Urinary tract infection, site not specified: Secondary | ICD-10-CM | POA: Insufficient documentation

## 2017-12-26 DIAGNOSIS — Z79899 Other long term (current) drug therapy: Secondary | ICD-10-CM | POA: Diagnosis not present

## 2017-12-26 DIAGNOSIS — Z87440 Personal history of urinary (tract) infections: Secondary | ICD-10-CM | POA: Insufficient documentation

## 2017-12-26 DIAGNOSIS — R1900 Intra-abdominal and pelvic swelling, mass and lump, unspecified site: Secondary | ICD-10-CM | POA: Diagnosis not present

## 2017-12-26 DIAGNOSIS — R195 Other fecal abnormalities: Secondary | ICD-10-CM | POA: Diagnosis not present

## 2017-12-26 DIAGNOSIS — Z7982 Long term (current) use of aspirin: Secondary | ICD-10-CM | POA: Insufficient documentation

## 2017-12-26 DIAGNOSIS — R197 Diarrhea, unspecified: Secondary | ICD-10-CM | POA: Diagnosis present

## 2017-12-26 LAB — CBC WITH DIFFERENTIAL/PLATELET
BASOS ABS: 0 10*3/uL (ref 0.0–0.1)
BASOS PCT: 0 %
EOS ABS: 0.1 10*3/uL (ref 0.0–0.7)
EOS PCT: 1 %
HEMATOCRIT: 28.8 % — AB (ref 39.0–52.0)
Hemoglobin: 9.1 g/dL — ABNORMAL LOW (ref 13.0–17.0)
Lymphocytes Relative: 24 %
Lymphs Abs: 2.2 10*3/uL (ref 0.7–4.0)
MCH: 23.8 pg — ABNORMAL LOW (ref 26.0–34.0)
MCHC: 31.6 g/dL (ref 30.0–36.0)
MCV: 75.4 fL — ABNORMAL LOW (ref 78.0–100.0)
MONO ABS: 0.9 10*3/uL (ref 0.1–1.0)
Monocytes Relative: 10 %
NEUTROS ABS: 6.1 10*3/uL (ref 1.7–7.7)
Neutrophils Relative %: 65 %
PLATELETS: 257 10*3/uL (ref 150–400)
RBC: 3.82 MIL/uL — ABNORMAL LOW (ref 4.22–5.81)
RDW: 13.9 % (ref 11.5–15.5)
WBC: 9.3 10*3/uL (ref 4.0–10.5)

## 2017-12-26 LAB — URINALYSIS, ROUTINE W REFLEX MICROSCOPIC
Bilirubin Urine: NEGATIVE
Glucose, UA: NEGATIVE mg/dL
Ketones, ur: NEGATIVE mg/dL
Nitrite: NEGATIVE
PROTEIN: 100 mg/dL — AB
SPECIFIC GRAVITY, URINE: 1.011 (ref 1.005–1.030)
pH: 5 (ref 5.0–8.0)

## 2017-12-26 LAB — COMPREHENSIVE METABOLIC PANEL
ALBUMIN: 3 g/dL — AB (ref 3.5–5.0)
ALT: 14 U/L — ABNORMAL LOW (ref 17–63)
ANION GAP: 8 (ref 5–15)
AST: 16 U/L (ref 15–41)
Alkaline Phosphatase: 61 U/L (ref 38–126)
BUN: 24 mg/dL — AB (ref 6–20)
CHLORIDE: 105 mmol/L (ref 101–111)
CO2: 24 mmol/L (ref 22–32)
Calcium: 9.2 mg/dL (ref 8.9–10.3)
Creatinine, Ser: 1.69 mg/dL — ABNORMAL HIGH (ref 0.61–1.24)
GFR calc Af Amer: 45 mL/min — ABNORMAL LOW (ref >60)
GFR calc non Af Amer: 38 mL/min — ABNORMAL LOW (ref >60)
GLUCOSE: 101 mg/dL — AB (ref 65–99)
POTASSIUM: 3.7 mmol/L (ref 3.5–5.1)
SODIUM: 137 mmol/L (ref 135–145)
TOTAL PROTEIN: 6 g/dL — AB (ref 6.5–8.1)
Total Bilirubin: 0.5 mg/dL (ref 0.3–1.2)

## 2017-12-26 LAB — C DIFFICILE QUICK SCREEN W PCR REFLEX
C DIFFICLE (CDIFF) ANTIGEN: NEGATIVE
C Diff interpretation: NOT DETECTED
C Diff toxin: NEGATIVE

## 2017-12-26 MED ORDER — SODIUM CHLORIDE 0.9 % IV BOLUS (SEPSIS)
500.0000 mL | Freq: Once | INTRAVENOUS | Status: AC
  Administered 2017-12-26: 500 mL via INTRAVENOUS

## 2017-12-26 MED ORDER — SODIUM CHLORIDE 0.9 % IV SOLN
1.0000 g | Freq: Once | INTRAVENOUS | Status: AC
  Administered 2017-12-26: 1 g via INTRAVENOUS
  Filled 2017-12-26: qty 10

## 2017-12-26 MED ORDER — CEPHALEXIN 500 MG PO CAPS: 500.0000 mg | ORAL_CAPSULE | Freq: Two times a day (BID) | ORAL | 0 refills | Status: DC

## 2017-12-26 MED ORDER — IOPAMIDOL (ISOVUE-300) INJECTION 61%
75.0000 mL | Freq: Once | INTRAVENOUS | Status: AC | PRN
  Administered 2017-12-26: 75 mL via INTRAVENOUS

## 2017-12-26 NOTE — Telephone Encounter (Signed)
Patient was scheduled for colonoscopy/EGD with [name] upcoming due to weight loss, worsening anemia, loss of appetite. CT was contemplated. However, he presented to the ED today and CT was completed with findings as below:   IMPRESSION: 1. 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 Difficult to determine if lesion compresses or arises from the bladder or both. Differential would include primary bladder wall neoplasm, atypical urachal carcinoma, GI stromal tumor, sarcoma or lymphoma. 2. Ventral abdominal hernia contains nonobstructed loops small bowel. 3. Prostate hypertrophy.   I will discuss with Oncology.

## 2017-12-26 NOTE — ED Triage Notes (Signed)
Pt c/o of diarrhea x 1 month. Pt was  Placed on two antibiotics recently for a bladder infection. Denied ABD pain or fever.

## 2017-12-26 NOTE — ED Provider Notes (Signed)
[facility] EMERGENCY DEPARTMENT Provider Note   CSN: [id number] Arrival date & time: 12/26/17  4315     History   Chief Complaint Chief Complaint  Patient presents with  . Diarrhea    HPI Benjamin Chaney is a 72 y.o. male.  HPI Patient presents with 3-4 episodes of loose stools overnight.  States he has had intermittent diarrhea for the past few months.  Now endorses anorexia the last 2 weeks.  No nausea or vomiting.  Patient states he does have abdominal hernias but denies any pain currently.  Was recently treated for urinary tract infection states that urinary symptoms have completely resolved.  No fever or chills. Past Medical History:  Diagnosis Date  . Cataract   . Chronic kidney disease, stage III (moderate) ([place]) 12/26/2012  . Colon cancer ([place]) 1997   left hemicolectomy   . Heart murmur   . Hypercholesterolemia   . Hypertension   . Microcytic anemia 12/19/2017  . Obstructive sleep apnea 09/14/2014    Patient Active Problem List   Diagnosis Date Noted  . Loss of weight 12/19/2017  . Microcytic anemia 12/19/2017  . Hematuria, gross 11/15/2017  . Benign prostatic hyperplasia with urinary obstruction 05/22/2017  . Small intestinal bacterial overgrowth 12/25/2014  . Obstructive sleep apnea 09/14/2014  . Persistent atrial fibrillation ([place]) 09/14/2014  . Loose stools 03/05/2014  . History of colon cancer 03/05/2014  . Chronic kidney disease, stage III (moderate) ([place]) 12/26/2012  . Essential hypertension 12/26/2012  . Colon cancer ([place]) 11/01/1995    Past Surgical History:  Procedure Laterality Date  . BACK SURGERY    . BACTERIAL OVERGROWTH TEST N/A 10/01/2014   POSITIVE FOR SIBO  . COLON SURGERY    . COLONOSCOPY  2004   Dr. Lindalou Hose: normal  . COLONOSCOPY N/A 03/11/2014   NL ILEUM, & COLON Bx, POLYPOID LESIONS, SML IH, NL ANASTOMOSIS  . HERNIA REPAIR     in his 45s  . SPINE SURGERY         Home Medications    Prior to Admission medications     Medication Sig Start Date End Date Taking? Authorizing Provider  aspirin 325 MG tablet Take 325 mg by mouth daily.   Yes [provider]  benazepril (LOTENSIN) 40 MG tablet Take 40 mg by mouth daily.  02/07/15  Yes [provider]  dicyclomine (BENTYL) 10 MG capsule TAKE ONE TO TWO CAPSULES BY MOUTH BEFORE BREAKFAST AND  BEFORE  LUNCH  FOR  DIARRHEA  AS  NEEDED 06/06/17  Yes Annitta Needs, NP  diltiazem (CARDIZEM CD) 240 MG 24 hr capsule Take 240 mg by mouth daily.    Yes [provider]  doxazosin (CARDURA) 4 MG tablet Take 4 mg by mouth daily.   Yes [provider]  dutasteride (AVODART) 0.5 MG capsule Take 0.5 mg by mouth daily.   Yes [provider]  furosemide (LASIX) 40 MG tablet Take 40 mg by mouth daily.  12/25/12  Yes [provider]  cephALEXin (KEFLEX) 500 MG capsule Take 1 capsule (500 mg total) by mouth 2 (two) times daily. 12/26/17   Julianne Rice, MD  traMADol (ULTRAM) 50 MG tablet Take 1 tablet (50 mg total) by mouth every 6 (six) hours as needed. Patient not taking: Reported on 12/20/2017 12/08/17   Carmin Muskrat, MD    Family History Family History  Problem Relation Age of Onset  . Heart disease Mother        died at 66  .  COPD Mother   . Alcohol abuse Mother   . Alcohol abuse Father   . Diabetes Brother   . Hypertension Brother   . Kidney disease Brother        dialysis  . Diabetes Brother   . Colon cancer Neg Hx   . Colon polyps Neg Hx     Social History Social History   Tobacco Use  . Smoking status: Never Smoker  . Smokeless tobacco: Never Used  . Tobacco comment: Never smoked  Substance Use Topics  . Alcohol use: No  . Drug use: No     Allergies   Patient has no known allergies.   Review of Systems Review of Systems  Constitutional: Positive for appetite change and fatigue. Negative for chills and fever.  Eyes: Negative for photophobia and visual disturbance.  Respiratory: Negative for  shortness of breath and wheezing.   Cardiovascular: Negative for chest pain, palpitations and leg swelling.  Gastrointestinal: Positive for diarrhea. Negative for abdominal pain, nausea and vomiting.  Genitourinary: Negative for dysuria, flank pain and frequency.  Musculoskeletal: Negative for back pain, myalgias, neck pain and neck stiffness.  Skin: Negative for rash and wound.  Neurological: Negative for dizziness, weakness, light-headedness, numbness and headaches.  All other systems reviewed and are negative.    Physical Exam Updated Vital Signs BP 129/81   Pulse 74   Temp 97.7 F (36.5 C) (Oral)   Resp 16   Ht 5\' 9"  (1.753 m)   Wt 72.6 kg (160 lb)   SpO2 99%   BMI 23.63 kg/m   Physical Exam  Constitutional: He is oriented to person, place, and time. He appears well-developed and well-nourished. No distress.  HENT:  Head: Normocephalic and atraumatic.  Mouth/Throat: Oropharynx is clear and moist. No oropharyngeal exudate.  Eyes: EOM are normal. Pupils are equal, round, and reactive to light.  Neck: Normal range of motion. Neck supple.  Cardiovascular: Normal rate and regular rhythm. Exam reveals no gallop and no friction rub.  No murmur heard. Pulmonary/Chest: Effort normal and breath sounds normal. No stridor. No respiratory distress. He has no wheezes. He has no rales. He exhibits no tenderness.  Abdominal: Soft. Bowel sounds are normal. He exhibits mass. There is tenderness. There is no rebound and no guarding. A hernia is present.  Patient has ventral hernia that is reducible.  He also has mass in the suprapubic region with tenderness to palpation.  Not pulsatile.  Musculoskeletal: Normal range of motion. He exhibits no edema or tenderness.  Neurological: He is alert and oriented to person, place, and time.  Moves all extremities without focal deficit.  Sensation intact.  Skin: Skin is warm and dry. No rash noted. He is not diaphoretic. No erythema.  Psychiatric: His  behavior is normal.  Flat affect  Nursing note and vitals reviewed.    ED Treatments / Results  Labs (all labs ordered are listed, but only abnormal results are displayed) Labs Reviewed  CBC WITH DIFFERENTIAL/PLATELET - Abnormal; Notable for the following components:      Result Value   RBC 3.82 (*)    Hemoglobin 9.1 (*)    HCT 28.8 (*)    MCV 75.4 (*)    MCH 23.8 (*)    All other components within normal limits  COMPREHENSIVE METABOLIC PANEL - Abnormal; Notable for the following components:   Glucose, Bld 101 (*)    BUN 24 (*)    Creatinine, Ser 1.69 (*)    Total Protein 6.0 (*)  Albumin 3.0 (*)    ALT 14 (*)    GFR calc non Af Amer 38 (*)    GFR calc Af Amer 45 (*)    All other components within normal limits  URINALYSIS, ROUTINE W REFLEX MICROSCOPIC - Abnormal; Notable for the following components:   APPearance CLOUDY (*)    Hgb urine dipstick MODERATE (*)    Protein, ur 100 (*)    Leukocytes, UA MODERATE (*)    Bacteria, UA RARE (*)    Squamous Epithelial / LPF 0-5 (*)    All other components within normal limits  C DIFFICILE QUICK SCREEN W PCR REFLEX  URINE CULTURE    EKG  EKG Interpretation None       Radiology Ct Abdomen Pelvis W Contrast  Result Date: 12/26/2017 CLINICAL DATA:  LOSS OF APPETITE WITH DIARRHEA X 1 MONTH. HX COLON POLYPS REMOVED. ^41mL ISOVUE-300 IOPAMIDOL (ISOVUE-300) INJECTION 61% EXAM: CT ABDOMEN AND PELVIS WITH CONTRAST TECHNIQUE: Multidetector CT imaging of the abdomen and pelvis was performed using the standard protocol following bolus administration of intravenous contrast. CONTRAST:  33mL ISOVUE-300 IOPAMIDOL (ISOVUE-300) INJECTION 61% COMPARISON:  None. FINDINGS: Lower chest: Lung bases are clear. Hepatobiliary: Small hepatic cysts.  Gallbladder Pancreas: Pancreatic duct is prominent but felt normal limits. No mass or. Spleen: Normal spleen Adrenals/urinary tract: Adrenal glands are normal. Small bilateral renal cysts noted. Ureters  grossly There is a large mass contiguous with the dome of the bladder extending out of pelvis the level of the umbilicus. Mass measures 11.5 by 12.7 12.0 cm (volume = 918 cm^3). Mass compresses the bladder a portion of compressed bladder extending into the RIGHT iliac fossa (image 50, series 2) Mass relatively homogeneous with internal enhancement and vascularity. On sagittal projection difficulty cell in mass arises from the dome of the bladder wall or compresses the dome. Bladder wall enhancing adjacent mass Stomach/Bowel: Stomach duodenum small-bowel normal. No obstruction. Loop of small bowel does enter ventral hernia without evidence obstruction. Hernia superior to the umbilicus (image 30, series Vascular/Lymphatic: No evidence of pelvic lymphadenopathy. No periaortic adenopathy calcification Reproductive: Prostate gland is enlarged the base bladder measuring 5.7 cm in diameter Other: No free fluid. Musculoskeletal: No aggressive osseous lesion. IMPRESSION: 1. 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 Difficult to determine if lesion compresses or arises from the bladder or both. Differential would include primary bladder wall neoplasm, atypical urachal carcinoma, GI stromal tumor, sarcoma or lymphoma. 2. Ventral abdominal hernia contains nonobstructed loops small bowel. 3. Prostate hypertrophy. Electronically Signed   By: Suzy Bouchard M.D.   On: 12/26/2017 10:26    Procedures Procedures (including critical care time)  Medications Ordered in ED Medications  sodium chloride 0.9 % bolus 500 mL (0 mLs Intravenous Stopped 12/26/17 0927)  iopamidol (ISOVUE-300) 61 % injection 75 mL (75 mLs Intravenous Contrast Given 12/26/17 0948)  cefTRIAXone (ROCEPHIN) 1 g in sodium chloride 0.9 % 100 mL IVPB (0 g Intravenous Stopped 12/26/17 1229)  sodium chloride 0.9 % bolus 500 mL (0 mLs Intravenous Stopped 12/26/17 1229)     Initial Impression /  Assessment and Plan / ED Course  I have reviewed the triage vital signs and the nursing notes.  Pertinent labs & imaging results that were available during my care of the patient were reviewed by me and considered in my medical decision making (see chart for details).  Clinical Course as of Dec 26 1304  Tue Dec 26, 2017  1301 Creatinine: (!) 1.69 [DY]    Clinical Course User Index [DY] Julianne Rice, MD    Form patient of mass near her bladder and need for close follow-up.  Has follow-up appointment with his gastroenterologist this week.  Have given referral to urology as well.  Likely has recurrent UTI.  Urine was sent for culture.  Will give extended course of antibiotics.  No loose stools in the emergency department.  Return precautions have been given.  Final Clinical Impressions(s) / ED Diagnoses   Final diagnoses:  Abdominal mass, unspecified abdominal location  Recurrent UTI  Loose stools    ED Discharge Orders        Ordered    cephALEXin (KEFLEX) 500 MG capsule  2 times daily     12/26/17 1305       Julianne Rice, MD 12/26/17 1306

## 2017-12-26 NOTE — Progress Notes (Signed)
TSH is normal. Iron is low at 18, ferritin elevated, but this is likely an acute phase reactant. Appears a mixed pattern anemia. However, he presented to the ED and was found to have an abdominal mass. I am going to discuss with Oncology.

## 2017-12-27 NOTE — Telephone Encounter (Signed)
Patient aware we are cancelling procedure

## 2017-12-27 NOTE — Telephone Encounter (Signed)
Discussed with Mike Craze, NP. Cancel colonoscopy/EGD for 3/5. Needs urgent referral to Urology (he is seen by [facility] Dr. Rosana Hoes). Please fax the CT report to their attention and also send to PCP. Please make sure the Urology clinic receives this report (verify with nursing staff). I want to make sure he gets plugged into the correct place for evaluation of this.

## 2017-12-27 NOTE — Telephone Encounter (Signed)
AB Pt called back and said he has an appointment with [facility] here in [place]. Appointment was set up at the hospital.   Please send message back to [facility].

## 2017-12-27 NOTE — Telephone Encounter (Signed)
This Is not an adequate message.  He has several issues and I cannot tell what he needs.

## 2017-12-27 NOTE — Telephone Encounter (Signed)
LMOM to call.

## 2017-12-28 ENCOUNTER — Telehealth: Payer: Self-pay

## 2017-12-28 LAB — URINE CULTURE

## 2017-12-28 NOTE — Telephone Encounter (Signed)
Noted  

## 2017-12-28 NOTE — Progress Notes (Signed)
Pt's wife, [name] is aware.

## 2017-12-28 NOTE — Telephone Encounter (Signed)
I have called [facility], his wife answered, stated he is not home right now, and will have him call when he gets home.

## 2017-12-28 NOTE — Telephone Encounter (Signed)
Benjamin Chaney, patient advised [name] he already has an appointment at [facility] urology. Do we [facility] need [facility]?

## 2017-12-28 NOTE — Telephone Encounter (Signed)
-----   Message from [name], MD sent at 12/28/2017  8:31 AM EST ----- Regarding: FW: abdominal mass Call patient.  See how he is doing.  If he is having problems I can see him today or tomorrow. ----- Message ----- From: Annitta Needs, NP Sent: 12/27/2017   4:25 PM To: [name], MD Subject: abdominal mass                                 Hi Dr. Meda Coffee!  I saw this patient and arranged colonoscopy/EGD, but he presented to ED in the interim and completed a CT showing a large mass that is difficult to determine if originating from bladder or compressing it. I have reached out to oncology and I am sending report to Urology at [facility]. Just wanted to give you an FYI, as it was done in the ED and I just happened to come across it when finishing my notes. Any other thoughts ?   Hope you are well! Vicente Males

## 2017-12-29 NOTE — Telephone Encounter (Signed)
No, keep appt with [facility]. May be easier to do that locally.

## 2017-12-29 NOTE — Telephone Encounter (Signed)
LMOVM making aware to keep appt with [facility].

## 2018-01-02 ENCOUNTER — Encounter (HOSPITAL_COMMUNITY): Payer: Self-pay

## 2018-01-02 ENCOUNTER — Ambulatory Visit (HOSPITAL_COMMUNITY): Admit: 2018-01-02 | Payer: Managed Care, Other (non HMO) | Admitting: Gastroenterology

## 2018-01-02 SURGERY — COLONOSCOPY
Anesthesia: Moderate Sedation

## 2018-01-05 ENCOUNTER — Telehealth: Payer: Self-pay | Admitting: Family Medicine

## 2018-01-05 DIAGNOSIS — R1909 Other intra-abdominal and pelvic swelling, mass and lump: Secondary | ICD-10-CM

## 2018-01-05 DIAGNOSIS — I4819 Other persistent atrial fibrillation: Secondary | ICD-10-CM

## 2018-01-05 NOTE — Telephone Encounter (Signed)
Please sign referral and I will have Dusty process.

## 2018-01-05 NOTE — Telephone Encounter (Signed)
Done

## 2018-01-05 NOTE — Telephone Encounter (Signed)
Pam from [facility] called to request clearance for patient to have surgical procedure for bladder mass. Cb#: [phone] ext. [phone]  I told her to go ahead and fax the request for in case as well.

## 2018-01-05 NOTE — Telephone Encounter (Signed)
He needs an urgent referral to cardiology for cardiology clearance for his surgery.

## 2018-01-08 ENCOUNTER — Telehealth: Payer: Self-pay | Admitting: Family Medicine

## 2018-01-08 NOTE — Telephone Encounter (Signed)
Called patient and advised of MD note. Patient is in contact with Urologist. States he will call with any issues after stopping antibiotics, and advised that we would look into referral to cardiologist.   Thanks!

## 2018-01-08 NOTE — Telephone Encounter (Signed)
Noted  

## 2018-01-08 NOTE — Telephone Encounter (Signed)
I would stop the antibiotics and monitor.  If symptoms start getting worse instead of better I would be happy to refill this antibiotic.  Make sure he is following up with the urology specialist.  Also, please contact dusty to make sure that he is going to see a cardiologist ASAP for clearance for surgery.  Referral is pending in the chart

## 2018-01-08 NOTE — Telephone Encounter (Signed)
Patient left message on nurse line stating that he is being treated for a bladder infection and has one day left of his medication. He is asking if he needs to come back for a follow-up visit, or if his medication needs to be extended. No other information given in message.

## 2018-01-08 NOTE — Telephone Encounter (Signed)
Just wanted to send this to you as well.  Thanks!

## 2018-01-08 NOTE — Telephone Encounter (Signed)
Called patient to see if he still had symptoms, continuing to have urgency and pressure symptoms other issues have resolved and improved. Would like to know if he needs to have more medication or if we need to see him. Please advise. Thank you!

## 2018-01-09 ENCOUNTER — Telehealth: Payer: Self-pay | Admitting: Family Medicine

## 2018-01-09 NOTE — Telephone Encounter (Signed)
Received patients forms to be completed for return to work authorization 01/08/18 at 4:40 pm.  Patient came by 01/09/18 am to pay the $29 fee.  The checklist, copy of receipt, and paperwork with envelope was all placed in a plastic sleeve and put in [place] folder to be charged in the chart.

## 2018-01-11 ENCOUNTER — Inpatient Hospital Stay (HOSPITAL_COMMUNITY)
Admission: EM | Admit: 2018-01-11 | Discharge: 2018-01-15 | DRG: 310 | Disposition: A | Discharge status: Home / Self Care | Payer: Managed Care, Other (non HMO) | Attending: Internal Medicine | Admitting: Internal Medicine

## 2018-01-11 ENCOUNTER — Encounter (HOSPITAL_COMMUNITY): Payer: Self-pay | Admitting: Emergency Medicine

## 2018-01-11 ENCOUNTER — Encounter: Payer: Self-pay | Admitting: Cardiovascular Disease

## 2018-01-11 ENCOUNTER — Ambulatory Visit (INDEPENDENT_AMBULATORY_CARE_PROVIDER_SITE_OTHER): Payer: Managed Care, Other (non HMO) | Admitting: Cardiovascular Disease

## 2018-01-11 ENCOUNTER — Other Ambulatory Visit: Payer: Self-pay

## 2018-01-11 ENCOUNTER — Emergency Department (HOSPITAL_COMMUNITY): Payer: Managed Care, Other (non HMO)

## 2018-01-11 VITALS — BP 106/61 | HR 67 | Ht 69.0 in | Wt 162.0 lb

## 2018-01-11 DIAGNOSIS — I10 Essential (primary) hypertension: Secondary | ICD-10-CM

## 2018-01-11 DIAGNOSIS — I482 Chronic atrial fibrillation: Principal | ICD-10-CM | POA: Diagnosis present

## 2018-01-11 DIAGNOSIS — D494 Neoplasm of unspecified behavior of bladder: Secondary | ICD-10-CM | POA: Diagnosis present

## 2018-01-11 DIAGNOSIS — N183 Chronic kidney disease, stage 3 unspecified: Secondary | ICD-10-CM

## 2018-01-11 DIAGNOSIS — R319 Hematuria, unspecified: Secondary | ICD-10-CM

## 2018-01-11 DIAGNOSIS — E1122 Type 2 diabetes mellitus with diabetic chronic kidney disease: Secondary | ICD-10-CM | POA: Diagnosis present

## 2018-01-11 DIAGNOSIS — I4891 Unspecified atrial fibrillation: Secondary | ICD-10-CM | POA: Diagnosis not present

## 2018-01-11 DIAGNOSIS — N3289 Other specified disorders of bladder: Secondary | ICD-10-CM | POA: Diagnosis present

## 2018-01-11 DIAGNOSIS — Z79899 Other long term (current) drug therapy: Secondary | ICD-10-CM

## 2018-01-11 DIAGNOSIS — Z7982 Long term (current) use of aspirin: Secondary | ICD-10-CM

## 2018-01-11 DIAGNOSIS — Z85038 Personal history of other malignant neoplasm of large intestine: Secondary | ICD-10-CM

## 2018-01-11 DIAGNOSIS — I129 Hypertensive chronic kidney disease with stage 1 through stage 4 chronic kidney disease, or unspecified chronic kidney disease: Secondary | ICD-10-CM | POA: Diagnosis present

## 2018-01-11 DIAGNOSIS — R310 Gross hematuria: Secondary | ICD-10-CM | POA: Diagnosis present

## 2018-01-11 DIAGNOSIS — D62 Acute posthemorrhagic anemia: Secondary | ICD-10-CM

## 2018-01-11 DIAGNOSIS — G4733 Obstructive sleep apnea (adult) (pediatric): Secondary | ICD-10-CM | POA: Diagnosis present

## 2018-01-11 DIAGNOSIS — Z9049 Acquired absence of other specified parts of digestive tract: Secondary | ICD-10-CM

## 2018-01-11 DIAGNOSIS — D649 Anemia, unspecified: Secondary | ICD-10-CM | POA: Diagnosis present

## 2018-01-11 DIAGNOSIS — E785 Hyperlipidemia, unspecified: Secondary | ICD-10-CM | POA: Diagnosis present

## 2018-01-11 DIAGNOSIS — E7800 Pure hypercholesterolemia, unspecified: Secondary | ICD-10-CM | POA: Diagnosis present

## 2018-01-11 LAB — COMPREHENSIVE METABOLIC PANEL
ALK PHOS: 71 U/L (ref 38–126)
ALT: 12 U/L — ABNORMAL LOW (ref 17–63)
AST: 14 U/L — AB (ref 15–41)
Albumin: 3 g/dL — ABNORMAL LOW (ref 3.5–5.0)
Anion gap: 8 (ref 5–15)
BUN: 20 mg/dL (ref 6–20)
CALCIUM: 9.2 mg/dL (ref 8.9–10.3)
CO2: 26 mmol/L (ref 22–32)
Chloride: 106 mmol/L (ref 101–111)
Creatinine, Ser: 1.35 mg/dL — ABNORMAL HIGH (ref 0.61–1.24)
GFR calc Af Amer: 59 mL/min — ABNORMAL LOW (ref >60)
GFR calc non Af Amer: 50 mL/min — ABNORMAL LOW (ref >60)
GLUCOSE: 114 mg/dL — AB (ref 65–99)
Potassium: 4 mmol/L (ref 3.5–5.1)
SODIUM: 140 mmol/L (ref 135–145)
Total Bilirubin: 0.6 mg/dL (ref 0.3–1.2)
Total Protein: 5.9 g/dL — ABNORMAL LOW (ref 6.5–8.1)

## 2018-01-11 LAB — CBC WITH DIFFERENTIAL/PLATELET
BASOS ABS: 0 10*3/uL (ref 0.0–0.1)
BASOS PCT: 0 %
EOS PCT: 1 %
Eosinophils Absolute: 0.1 10*3/uL (ref 0.0–0.7)
HEMATOCRIT: 28.1 % — AB (ref 39.0–52.0)
Hemoglobin: 8.8 g/dL — ABNORMAL LOW (ref 13.0–17.0)
LYMPHS PCT: 18 %
Lymphs Abs: 1.4 10*3/uL (ref 0.7–4.0)
MCH: 23.8 pg — ABNORMAL LOW (ref 26.0–34.0)
MCHC: 31.3 g/dL (ref 30.0–36.0)
MCV: 75.9 fL — AB (ref 78.0–100.0)
MONO ABS: 0.9 10*3/uL (ref 0.1–1.0)
Monocytes Relative: 11 %
NEUTROS ABS: 5.6 10*3/uL (ref 1.7–7.7)
Neutrophils Relative %: 70 %
PLATELETS: 249 10*3/uL (ref 150–400)
RBC: 3.7 MIL/uL — AB (ref 4.22–5.81)
RDW: 14.8 % (ref 11.5–15.5)
WBC: 8 10*3/uL (ref 4.0–10.5)

## 2018-01-11 LAB — TROPONIN I
Troponin I: <0.03 ng/mL (ref <0.03)
Troponin I: <0.03 ng/mL (ref <0.03)

## 2018-01-11 LAB — TSH: TSH: 1.33 u[IU]/mL (ref 0.350–4.500)

## 2018-01-11 MED ORDER — BENAZEPRIL HCL 10 MG PO TABS
40.0000 mg | ORAL_TABLET | Freq: Every day | ORAL | Status: DC
  Filled 2018-01-11: qty 1

## 2018-01-11 MED ORDER — ONDANSETRON HCL 4 MG/2ML IJ SOLN: 4.0000 mg | Freq: Four times a day (QID) | INTRAMUSCULAR | Status: DC | PRN

## 2018-01-11 MED ORDER — SODIUM CHLORIDE 0.9% FLUSH
3.0000 mL | Freq: Two times a day (BID) | INTRAVENOUS | Status: DC
  Administered 2018-01-11 – 2018-01-15 (×7): 3 mL via INTRAVENOUS

## 2018-01-11 MED ORDER — DILTIAZEM HCL ER COATED BEADS 180 MG PO CP24
300.0000 mg | ORAL_CAPSULE | Freq: Every day | ORAL | Status: DC
  Administered 2018-01-12 – 2018-01-13 (×2): 300 mg via ORAL
  Filled 2018-01-11 (×3): qty 1

## 2018-01-11 MED ORDER — DOXAZOSIN MESYLATE 2 MG PO TABS
4.0000 mg | ORAL_TABLET | Freq: Every day | ORAL | Status: DC
  Filled 2018-01-11: qty 1

## 2018-01-11 MED ORDER — DUTASTERIDE 0.5 MG PO CAPS
0.5000 mg | ORAL_CAPSULE | Freq: Every day | ORAL | Status: DC
  Administered 2018-01-12 – 2018-01-15 (×4): 0.5 mg via ORAL
  Filled 2018-01-11 (×7): qty 1

## 2018-01-11 MED ORDER — METHYLPREDNISOLONE SODIUM SUCC 125 MG IJ SOLR: 125.0000 mg | Freq: Once | INTRAMUSCULAR | Status: DC

## 2018-01-11 MED ORDER — DILTIAZEM HCL-DEXTROSE 100-5 MG/100ML-% IV SOLN (PREMIX)
5.0000 mg/h | Freq: Once | INTRAVENOUS | Status: AC
  Administered 2018-01-11: 5 mg/h via INTRAVENOUS
  Filled 2018-01-11: qty 100

## 2018-01-11 MED ORDER — ACETAMINOPHEN 325 MG PO TABS: 650.0000 mg | ORAL_TABLET | ORAL | Status: DC | PRN

## 2018-01-11 MED ORDER — DILTIAZEM HCL 25 MG/5ML IV SOLN
10.0000 mg | Freq: Once | INTRAVENOUS | Status: AC
Start: 2018-01-11 — End: 2018-01-11
  Administered 2018-01-11: 10 mg via INTRAVENOUS

## 2018-01-11 MED ORDER — ASPIRIN 325 MG PO TABS
325.0000 mg | ORAL_TABLET | Freq: Every day | ORAL | Status: DC
  Administered 2018-01-11 – 2018-01-15 (×5): 325 mg via ORAL
  Filled 2018-01-11 (×5): qty 1

## 2018-01-11 MED ORDER — SODIUM CHLORIDE 0.9% FLUSH: 3.0000 mL | INTRAVENOUS | Status: DC | PRN

## 2018-01-11 MED ORDER — SODIUM CHLORIDE 0.9 % IV SOLN: 250.0000 mL | INTRAVENOUS | Status: DC | PRN

## 2018-01-11 MED ORDER — DILTIAZEM HCL 60 MG PO TABS
60.0000 mg | ORAL_TABLET | Freq: Three times a day (TID) | ORAL | Status: DC
  Administered 2018-01-11 – 2018-01-12 (×3): 60 mg via ORAL
  Filled 2018-01-11: qty 1
  Filled 2018-01-11: qty 2
  Filled 2018-01-11: qty 1

## 2018-01-11 NOTE — ED Triage Notes (Signed)
Pt was sent over by cardiology office for high heart rate.  Pt denies any s/s or pain at this time.

## 2018-01-11 NOTE — ED Notes (Signed)
Pt got up to use the urinal and his heart rate went up to 150's dr Shanon Brow notified about this and his cardizem gtt dr Shanon Brow gave an order to given 10 mg of iv cardizem and observe the patient for 1 hour also give the loading dose of po cardizem if patient's heart is below 110 after the hour dc the gtt and admit to 300 if >110 call dr Shanon Brow for new orders

## 2018-01-11 NOTE — H&P (Signed)
History and Physical    RYAAN VANWAGONER YBO:[id number] DOB: [birth year] DOA: 01/11/2018  PCP: Caren Macadam, MD  Patient coming from: Office PCP  Chief Complaint: Abnormal heart rate  HPI: Benjamin Chaney is a 72 y.o. male with medical history significant of recent finding of a new bladder mass, chronic kidney disease was seeing cardiology for preoperative evaluation for bladder surgery when he was noted to be in A. fib with RVR.  Patient denies any palpitations.  He denies any shortness of breath.  He denies any recent fevers cough or lower extremity edema.  He denies any chest pain.  Patient was sent to the emergency room from the cardiology office for A. fib with RVR.  Patient heart rate was 140 there is currently less than 120 here.  Patient's been started on a Cardizem drip and his heart rate is ranging around 70-80.  Patient is being referred for admission for A. fib with RVR.   Review of Systems: As per HPI otherwise 10 point review of systems negative.   Past Medical History:  Diagnosis Date  . Cataract   . Chronic kidney disease, stage III (moderate) ([place]) 12/26/2012  . Colon cancer ([place]) 1997   left hemicolectomy   . Heart murmur   . Hypercholesterolemia   . Hypertension   . Microcytic anemia 12/19/2017  . Obstructive sleep apnea 09/14/2014    Past Surgical History:  Procedure Laterality Date  . BACK SURGERY    . BACTERIAL OVERGROWTH TEST N/A 10/01/2014   POSITIVE FOR SIBO  . COLON SURGERY    . COLONOSCOPY  2004   Dr. Lindalou Hose: normal  . COLONOSCOPY N/A 03/11/2014   NL ILEUM, & COLON Bx, POLYPOID LESIONS, SML IH, NL ANASTOMOSIS  . HERNIA REPAIR     in his 21s  . SPINE SURGERY       reports that  has never smoked. he has never used smokeless tobacco. He reports that he does not drink alcohol or use drugs.  No Known Allergies  Family History  Problem Relation Age of Onset  . Heart disease Mother        died at 47  . COPD Mother   . Alcohol abuse Mother   .  Alcohol abuse Father   . Diabetes Brother   . Hypertension Brother   . Kidney disease Brother        dialysis  . Diabetes Brother   . Colon cancer Neg Hx   . Colon polyps Neg Hx     Prior to Admission medications   Medication Sig Start Date End Date Taking? Authorizing Provider  aspirin 325 MG tablet Take 325 mg by mouth daily.   Yes [provider]  benazepril (LOTENSIN) 40 MG tablet Take 40 mg by mouth daily.  02/07/15  Yes [provider]  dicyclomine (BENTYL) 10 MG capsule TAKE ONE TO TWO CAPSULES BY MOUTH BEFORE BREAKFAST AND  BEFORE  LUNCH  FOR  DIARRHEA  AS  NEEDED 06/06/17  Yes Annitta Needs, NP  diltiazem (CARDIZEM CD) 240 MG 24 hr capsule Take 240 mg by mouth daily.    Yes [provider]  doxazosin (CARDURA) 4 MG tablet Take 4 mg by mouth daily.   Yes [provider]  dutasteride (AVODART) 0.5 MG capsule Take 0.5 mg by mouth daily.   Yes [provider]  furosemide (LASIX) 40 MG tablet Take 40 mg by mouth daily.  12/25/12  Yes [provider]    Physical Exam:  Vitals:   01/11/18 1400 01/11/18 1430 01/11/18 1500 01/11/18 1530  BP: 123/88 (!) 125/97 126/87 (!) 139/97  Pulse: 71 99    Resp: 13 13 16 15   Temp:      TempSrc:      SpO2: 99% 94%    Weight:      Height:          Constitutional: NAD, calm, comfortable Vitals:   01/11/18 1400 01/11/18 1430 01/11/18 1500 01/11/18 1530  BP: 123/88 (!) 125/97 126/87 (!) 139/97  Pulse: 71 99    Resp: 13 13 16 15   Temp:      TempSrc:      SpO2: 99% 94%    Weight:      Height:       Eyes: PERRL, lids and conjunctivae normal ENMT: Mucous membranes are moist. Posterior pharynx clear of any exudate or lesions.Normal dentition.  Neck: normal, supple, no masses, no thyromegaly Respiratory: clear to auscultation bilaterally, no wheezing, no crackles. Normal respiratory effort. No accessory muscle use.  Cardiovascular: Regular rate and rhythm, no murmurs / rubs / gallops. No  extremity edema. 2+ pedal pulses. No carotid bruits.  Abdomen: no tenderness, no masses palpated. No hepatosplenomegaly. Bowel sounds positive.  Musculoskeletal: no clubbing / cyanosis. No joint deformity upper and lower extremities. Good ROM, no contractures. Normal muscle tone.  Skin: no rashes, lesions, ulcers. No induration Neurologic: CN 2-12 grossly intact. Sensation intact, DTR normal. Strength 5/5 in all 4.  Psychiatric: Normal judgment and insight. Alert and oriented x 3. Normal mood.    Labs on Admission: I have personally reviewed following labs and imaging studies  CBC: Recent Labs  Lab 01/11/18 1056  WBC 8.0  NEUTROABS 5.6  HGB 8.8*  HCT 28.1*  MCV 75.9*  PLT 462   Basic Metabolic Panel: Recent Labs  Lab 01/11/18 1056  NA 140  K 4.0  CL 106  CO2 26  GLUCOSE 114*  BUN 20  CREATININE 1.35*  CALCIUM 9.2   GFR: Estimated Creatinine Clearance: 48.7 mL/min (A) (by C-G formula based on SCr of 1.35 mg/dL (H)). Liver Function Tests: Recent Labs  Lab 01/11/18 1056  AST 14*  ALT 12*  ALKPHOS 71  BILITOT 0.6  PROT 5.9*  ALBUMIN 3.0*   No results for input(s): LIPASE, AMYLASE in the last 168 hours. No results for input(s): AMMONIA in the last 168 hours. Coagulation Profile: No results for input(s): INR, PROTIME in the last 168 hours. Cardiac Enzymes: Recent Labs  Lab 01/11/18 1056  TROPONINI <0.03   BNP (last 3 results) No results for input(s): PROBNP in the last 8760 hours. HbA1C: No results for input(s): HGBA1C in the last 72 hours. CBG: No results for input(s): GLUCAP in the last 168 hours. Lipid Profile: No results for input(s): CHOL, HDL, LDLCALC, TRIG, CHOLHDL, LDLDIRECT in the last 72 hours. Thyroid Function Tests: No results for input(s): TSH, T4TOTAL, FREET4, T3FREE, THYROIDAB in the last 72 hours. Anemia Panel: No results for input(s): VITAMINB12, FOLATE, FERRITIN, TIBC, IRON, RETICCTPCT in the last 72 hours. Urine analysis:      Component Value Date/Time   COLORURINE YELLOW 12/26/2017 0817   APPEARANCEUR CLOUDY (A) 12/26/2017 0817   LABSPEC 1.011 12/26/2017 0817   PHURINE 5.0 12/26/2017 0817   GLUCOSEU NEGATIVE 12/26/2017 0817   HGBUR MODERATE (A) 12/26/2017 0817   BILIRUBINUR NEGATIVE 12/26/2017 0817   BILIRUBINUR neg 12/05/2017 1451   KETONESUR NEGATIVE 12/26/2017 0817   PROTEINUR 100 (A) 12/26/2017 0817   UROBILINOGEN  1.0 12/05/2017 1451   NITRITE NEGATIVE 12/26/2017 0817   LEUKOCYTESUR MODERATE (A) 12/26/2017 0817   Sepsis Labs: !!!!!!!!!!!!!!!!!!!!!!!!!!!!!!!!!!!!!!!!!!!! @LABRCNTIP (procalcitonin:4,lacticidven:4) )No results found for this or any previous visit (from the past 240 hour(s)).   Radiological Exams on Admission: Dg Chest 2 View  Result Date: 01/11/2018 CLINICAL DATA:  Weakness EXAM: CHEST - 2 VIEW COMPARISON:  None. FINDINGS: Heart size upper normal. Negative for heart failure. Lungs are clear without infiltrate effusion or mass. No acute skeletal abnormality. IMPRESSION: No active cardiopulmonary disease. Electronically Signed   By: Franchot Gallo M.D.   On: 01/11/2018 11:27    EKG: Independently reviewed. A. fib with RVR Chest x-ray reviewed no edema or infiltrate Old chart reviewed Case discussed with EDP  Assessment/Plan 72 year old male with recently new bladder mass and hematuria comes in with A. fib with RVR  Principal Problem:   Atrial fibrillation with RVR (HCC)-patient is on Cardizem long-acting 240 mg orally daily we will increase this to 300 mg daily and orally load him with 60 mg p.o. now.  Wean drip off.  Check TSH level.  Check cardiac echo in the morning.  Serial troponin.  Aspirin.  Cardiology has been also consulted and will see patient in follow.  Active Problems:   Chronic kidney disease, stage III (moderate) (HCC)-stable at this time    Essential hypertension-patient's blood pressures well controlled at this time of note him increasing his calcium channel blocker  for better rate control    Obstructive sleep apnea-stable    Hematuria, gross-as above.  Continue aspirin at this time.  We will hold off on further full  anticoagulation as he is being worked up for surgical procedure to his bladder soon.    Bladder mass-continue outpatient follow-up.     DVT prophylaxis: SCDs Code Status: Full Family Communication: None Disposition Plan: Per day team Consults called: Cardiology Admission status: Observation   Adarius Tigges A MD [facility]  If 7PM-7AM, please contact night-coverage [email or web address] Password [facility]  01/11/2018, 4:32 PM

## 2018-01-11 NOTE — Patient Instructions (Signed)
Medication Instructions:  Continue all current medications.  Labwork: none  Testing/Procedures: none  Follow-Up: Going to [facility] ED now.   Any Other Special Instructions Will Be Listed Below (If Applicable).  If you need a refill on your cardiac medications before your next appointment, please call your pharmacy.

## 2018-01-11 NOTE — Progress Notes (Signed)
CARDIOLOGY CONSULT NOTE  Patient ID: [name] MRN: [id number] DOB/AGE: Nov 19, 1945 72 y.o.  Admit date: (Not on file) Primary Physician: Caren Macadam, MD Referring Physician: Dr. Meda Coffee  Reason for Consultation: Atrial fibrillation  HPI: Benjamin Chaney is a 72 y.o. male who is being seen today for the evaluation of atrial fibrillation at the request of [name], MD.   I reviewed the electronic medical record.  He was most recently seen by cardiologist on 09/15/14 and appears to have a long history of atrial fibrillation.  He has a history of hypertension but no diabetes, coronary disease, or CHF.  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  He was offered anticoagulation at that time but he deferred and apparently wanted to speak to his PCP.  ECG reportedly demonstrated atrial fibrillation at that time.  An echocardiogram was apparently performed in 2015 but I do not have a copy of this report.  I did find an echocardiogram report from July 2009 which demonstrated normal left ventricular systolic function, LVEF 01-74%, moderate left atrial dilatation, and trivial mitral and tricuspid regurgitation.  He is currently taking aspirin 325 mg and long-acting diltiazem 240 milligrams daily.  He was recently evaluated for cystitis and hematuria.    He underwent a CT of the abdomen and pelvis on 12/26/17 which demonstrated a large mass contiguous with the dome of the bladder with the differential diagnosis including neoplasm.  Most recent labs performed on 12/26/17 show hemoglobin low at 9.1 which had been 11.4 nine months prior to that.  He also appears to have chronic kidney disease stage III.  BUN was 24 and creatinine was 1.69 (GFR 45 ml/min). TSH was normal at 1.79 on 12/19/17.  ECG performed today which I personally reviewed demonstrated rapid atrial fibrillation with ectopic ventricular beats, 139 bpm.  He was told he needs to see a cardiologist  before undergoing further invasive evaluation for his bladder tumor.  He currently denies chest pain, palpitations, shortness of breath, dizziness, leg swelling, and syncope.  His primary complaints relate to mild suprapubic discomfort.  He denies visible hematuria.  No Known Allergies  Current Outpatient Medications  Medication Sig Dispense Refill  . aspirin 325 MG tablet Take 325 mg by mouth daily.    . benazepril (LOTENSIN) 40 MG tablet Take 40 mg by mouth daily.     . cephALEXin (KEFLEX) 500 MG capsule Take 1 capsule (500 mg total) by mouth 2 (two) times daily. 28 capsule 0  . dicyclomine (BENTYL) 10 MG capsule TAKE ONE TO TWO CAPSULES BY MOUTH BEFORE BREAKFAST AND  BEFORE  LUNCH  FOR  DIARRHEA  AS  NEEDED 90 capsule 5  . diltiazem (CARDIZEM CD) 240 MG 24 hr capsule Take 240 mg by mouth daily.     Marland Kitchen doxazosin (CARDURA) 4 MG tablet Take 4 mg by mouth daily.    Marland Kitchen dutasteride (AVODART) 0.5 MG capsule Take 0.5 mg by mouth daily.    . furosemide (LASIX) 40 MG tablet Take 40 mg by mouth daily.     . traMADol (ULTRAM) 50 MG tablet Take 1 tablet (50 mg total) by mouth every 6 (six) hours as needed. (Patient not taking: Reported on 12/20/2017) 15 tablet 0   No current facility-administered medications for this visit.     Past Medical History:  Diagnosis Date  . Cataract   . Chronic kidney disease, stage III (moderate) ([place]) 12/26/2012  . Colon cancer (  [place]) 1997   left hemicolectomy   . Heart murmur   . Hypercholesterolemia   . Hypertension   . Microcytic anemia 12/19/2017  . Obstructive sleep apnea 09/14/2014    Past Surgical History:  Procedure Laterality Date  . BACK SURGERY    . BACTERIAL OVERGROWTH TEST N/A 10/01/2014   POSITIVE FOR SIBO  . COLON SURGERY    . COLONOSCOPY  2004   Dr. Lindalou Hose: normal  . COLONOSCOPY N/A 03/11/2014   NL ILEUM, & COLON Bx, POLYPOID LESIONS, SML IH, NL ANASTOMOSIS  . HERNIA REPAIR     in his 79s  . SPINE SURGERY      Social History    Socioeconomic History  . Marital status: Married    Spouse name: Benjamin Chaney  . Number of children: 5  . Years of education: Not on file  . Highest education level: Associate degree: academic program  Social Needs  . Financial resource strain: Not on file  . Food insecurity - worry: Not on file  . Food insecurity - inability: Not on file  . Transportation needs - medical: Not on file  . Transportation needs - non-medical: Not on file  Occupational History  . Occupation: Best boy  Tobacco Use  . Smoking status: Never Smoker  . Smokeless tobacco: Never Used  . Tobacco comment: Never smoked  Substance and Sexual Activity  . Alcohol use: No  . Drug use: No  . Sexual activity: Not Currently  Other Topics Concern  . Not on file  Social History Narrative   Lives with wife Benjamin Chaney   Has many children and grands and great grands   Ex wife "still in the neighborhood", with oldest son Benjamin Chaney     No family history of premature CAD in 1st degree relatives.  No outpatient medications have been marked as taking for the 01/11/18 encounter (Office Visit) with Herminio Commons, MD.      Review of systems complete and found to be negative unless listed above in HPI    Physical exam Blood pressure 106/61, pulse 67, height 5\' 9"  (1.753 m), weight 162 lb (73.5 kg), SpO2 95 %. General: NAD Neck: No JVD, no thyromegaly or thyroid nodule.  Lungs: Clear to auscultation bilaterally with normal respiratory effort. CV: Nondisplaced PMI. Tachycardic and irregular rhythm, normal S1/S2, no S3, no murmur.  No peripheral edema.  No carotid bruit.    Abdomen: Soft, mild suprapubic tenderness, no distention.  Skin: Intact without lesions or rashes.  Neurologic: Alert and oriented x 3.  Psych: Normal affect. Extremities: No clubbing or cyanosis.  HEENT: Normal.   ECG: Most recent ECG reviewed.   Labs: Lab Results  Component Value Date/Time   K 3.7 12/26/2017 08:29 AM   BUN 24  (H) 12/26/2017 08:29 AM   CREATININE 1.69 (H) 12/26/2017 08:29 AM   CREATININE 1.67 (H) 10/12/2017 03:46 PM   ALT 14 (L) 12/26/2017 08:29 AM   TSH 1.79 12/19/2017 12:59 PM   HGB 9.1 (L) 12/26/2017 08:29 AM     Lipids: Lab Results  Component Value Date/Time   LDLCALC 69 10/12/2017 03:46 PM   CHOL 149 10/12/2017 03:46 PM   TRIG 55 10/12/2017 03:46 PM   HDL 66 10/12/2017 03:46 PM        ASSESSMENT AND PLAN:   1.  Rapid atrial fibrillation: I checked his heart rate several times by auscultation and it remains in the 130-140 bpm range.  He is entirely asymptomatic.  I would consider holding benazepril  and increasing long-acting diltiazem to control his heart rates to avoid precipitating hypotension (current blood pressure is 106/61), but given his anemia with presumed ongoing blood loss due to a bladder tumor, he could possibly develop dizziness if not frank syncope with outpatient medication adjustments.  I spoke to him at length regarding this.  I recommended he proceed to the [facility] emergency department for hospitalization so that IV diltiazem can be used to control heart rate and withholding benazepril for the time being (he took it this morning).  He will need an echocardiogram to evaluate cardiac structure and function once heart rate control has been optimized for a more accurate assessment of LVEF.  It is possible he has developed a tachycardia-mediated cardiomyopathy in the interim as his atrial fibrillation appears to have been asymptomatic for several years and I am not certain how long he has been tachycardic. While CHADSVASC score is 2 indicating an elevated thromboembolic risk, given his significant anemia and presumed ongoing blood loss, I would not recommend systemic anticoagulation at this time.  This can be readdressed after a comprehensive assessment has been completed by urology for his bladder tumor.  2.  Bladder tumor: There is a high degree of suspicion for  a malignant neoplasm.  He has been referred to urology by his PCP.  3.  Acute blood loss anemia: This appears to be due to bladder tumor.  As stated above, I would recommend withholding all anticoagulation at this time including antiplatelet therapy until his tumor is addressed.  4.  Hypertension: Blood pressure is low normal.  I would recommend holding benazepril in order to use IV diltiazem to control rapid atrial fibrillation.  5.  Chronic kidney disease stage III: Labs reviewed above.  This is due in part to an obstructive process from his bladder tumor.  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   Disposition: Follow up with me after hospitalization.  As I have evaluated him today, the inpatient cardiology service can begin following him tomorrow.   Signed: Kate Sable, M.D., F.A.C.C.  01/11/2018, 9:24 AM

## 2018-01-11 NOTE — ED Provider Notes (Signed)
[facility] EMERGENCY DEPARTMENT Provider Note   CSN: [id number] Arrival date & time: 01/11/18  1019     History   Chief Complaint Chief Complaint  Patient presents with  . Tachycardia    HPI Benjamin Chaney is a 72 y.o. male.   Patient has a history of diabetes coronary disease and a bladder mass.  He was sent to cardiology for evaluation of whether he could have surgery for the bladder mass.  It was found that the patient has atrial fib at a rapid rate around 140.  The patient was sent to the emergency department to be put on Cardizem drip and admitted for atrial fib   The history is provided by the patient. No language interpreter was used.  Palpitations   This is a chronic problem. The current episode started more than 1 week ago. The problem occurs constantly. The problem has not changed since onset.The problem is associated with stress. Pertinent negatives include no diaphoresis, no chest pain, no abdominal pain, no headaches, no back pain and no cough.    Past Medical History:  Diagnosis Date  . Cataract   . Chronic kidney disease, stage III (moderate) ([place]) 12/26/2012  . Colon cancer ([place]) 1997   left hemicolectomy   . Heart murmur   . Hypercholesterolemia   . Hypertension   . Microcytic anemia 12/19/2017  . Obstructive sleep apnea 09/14/2014    Patient Active Problem List   Diagnosis Date Noted  . Loss of weight 12/19/2017  . Microcytic anemia 12/19/2017  . Hematuria, gross 11/15/2017  . Benign prostatic hyperplasia with urinary obstruction 05/22/2017  . Small intestinal bacterial overgrowth 12/25/2014  . Obstructive sleep apnea 09/14/2014  . Persistent atrial fibrillation ([place]) 09/14/2014  . Loose stools 03/05/2014  . History of colon cancer 03/05/2014  . Chronic kidney disease, stage III (moderate) ([place]) 12/26/2012  . Essential hypertension 12/26/2012  . Colon cancer ([place]) 11/01/1995    Past Surgical History:  Procedure Laterality Date  . BACK  SURGERY    . BACTERIAL OVERGROWTH TEST N/A 10/01/2014   POSITIVE FOR SIBO  . COLON SURGERY    . COLONOSCOPY  2004   Dr. Lindalou Hose: normal  . COLONOSCOPY N/A 03/11/2014   NL ILEUM, & COLON Bx, POLYPOID LESIONS, SML IH, NL ANASTOMOSIS  . HERNIA REPAIR     in his 70s  . SPINE SURGERY         Home Medications    Prior to Admission medications   Medication Sig Start Date End Date Taking? Authorizing Provider  aspirin 325 MG tablet Take 325 mg by mouth daily.   Yes [provider]  benazepril (LOTENSIN) 40 MG tablet Take 40 mg by mouth daily.  02/07/15  Yes [provider]  dicyclomine (BENTYL) 10 MG capsule TAKE ONE TO TWO CAPSULES BY MOUTH BEFORE BREAKFAST AND  BEFORE  LUNCH  FOR  DIARRHEA  AS  NEEDED 06/06/17  Yes Annitta Needs, NP  diltiazem (CARDIZEM CD) 240 MG 24 hr capsule Take 240 mg by mouth daily.    Yes [provider]  doxazosin (CARDURA) 4 MG tablet Take 4 mg by mouth daily.   Yes [provider]  dutasteride (AVODART) 0.5 MG capsule Take 0.5 mg by mouth daily.   Yes [provider]  furosemide (LASIX) 40 MG tablet Take 40 mg by mouth daily.  12/25/12  Yes [provider]    Family History Family History  Problem Relation Age of Onset  . Heart  disease Mother        died at 80  . COPD Mother   . Alcohol abuse Mother   . Alcohol abuse Father   . Diabetes Brother   . Hypertension Brother   . Kidney disease Brother        dialysis  . Diabetes Brother   . Colon cancer Neg Hx   . Colon polyps Neg Hx     Social History Social History   Tobacco Use  . Smoking status: Never Smoker  . Smokeless tobacco: Never Used  . Tobacco comment: Never smoked  Substance Use Topics  . Alcohol use: No  . Drug use: No     Allergies   Patient has no known allergies.   Review of Systems Review of Systems  Constitutional: Negative for appetite change, diaphoresis and fatigue.  HENT: Negative for congestion, ear discharge and  sinus pressure.   Eyes: Negative for discharge.  Respiratory: Negative for cough.   Cardiovascular: Positive for palpitations. Negative for chest pain.  Gastrointestinal: Negative for abdominal pain and diarrhea.  Genitourinary: Negative for frequency and hematuria.  Musculoskeletal: Negative for back pain.  Skin: Negative for rash.  Neurological: Negative for seizures and headaches.  Psychiatric/Behavioral: Negative for hallucinations.     Physical Exam Updated Vital Signs BP 116/89   Pulse 77   Temp 98.8 F (37.1 C) (Oral)   Resp 14   Ht 5\' 9"  (1.753 m)   Wt 73.5 kg (162 lb)   SpO2 99%   BMI 23.92 kg/m   Physical Exam  Constitutional: He is oriented to person, place, and time. He appears well-developed.  HENT:  Head: Normocephalic.  Eyes: Conjunctivae and EOM are normal. No scleral icterus.  Neck: Neck supple. No thyromegaly present.  Cardiovascular: Exam reveals no gallop and no friction rub.  No murmur heard. Irregular rapid heart rate  Pulmonary/Chest: No stridor. He has no wheezes. He has no rales. He exhibits no tenderness.  Abdominal: He exhibits no distension. There is no tenderness. There is no rebound.  Musculoskeletal: Normal range of motion. He exhibits no edema.  Lymphadenopathy:    He has no cervical adenopathy.  Neurological: He is oriented to person, place, and time. He exhibits normal muscle tone. Coordination normal.  Skin: No rash noted. No erythema.  Psychiatric: He has a normal mood and affect. His behavior is normal.     ED Treatments / Results  Labs (all labs ordered are listed, but only abnormal results are displayed) Labs Reviewed  CBC WITH DIFFERENTIAL/PLATELET - Abnormal; Notable for the following components:      Result Value   RBC 3.70 (*)    Hemoglobin 8.8 (*)    HCT 28.1 (*)    MCV 75.9 (*)    MCH 23.8 (*)    All other components within normal limits  COMPREHENSIVE METABOLIC PANEL - Abnormal; Notable for the following  components:   Glucose, Bld 114 (*)    Creatinine, Ser 1.35 (*)    Total Protein 5.9 (*)    Albumin 3.0 (*)    AST 14 (*)    ALT 12 (*)    GFR calc non Af Amer 50 (*)    GFR calc Af Amer 59 (*)    All other components within normal limits  TROPONIN I    EKG  EKG Interpretation  Date/Time:  Thursday January 11 2018 10:34:41 EDT Ventricular Rate:  110 PR Interval:    QRS Duration: 124 QT Interval:  337 QTC  Calculation: 456 R Axis:   49 Text Interpretation:  Atrial fibrillation Left bundle branch block Baseline wander in lead(s) V3 Confirmed by Milton Ferguson [phone]) on 01/11/2018 3:02:51 PM       Radiology Dg Chest 2 View  Result Date: 01/11/2018 CLINICAL DATA:  Weakness EXAM: CHEST - 2 VIEW COMPARISON:  None. FINDINGS: Heart size upper normal. Negative for heart failure. Lungs are clear without infiltrate effusion or mass. No acute skeletal abnormality. IMPRESSION: No active cardiopulmonary disease. Electronically Signed   By: Franchot Gallo M.D.   On: 01/11/2018 11:27    Procedures Procedures (including critical care time)  Medications Ordered in ED Medications  diltiazem (CARDIZEM) 100 mg in dextrose 5% 179mL (1 mg/mL) infusion (5 mg/hr Intravenous New Bag/Given 01/11/18 1159)     Initial Impression / Assessment and Plan / ED Course  I have reviewed the triage vital signs and the nursing notes.  Pertinent labs & imaging results that were available during my care of the patient were reviewed by me and considered in my medical decision making (see chart for details). CRITICAL CARE Performed by: Milton Ferguson Total critical care time: 40 minutes Critical care time was exclusive of separately billable procedures and treating other patients. Critical care was necessary to treat or prevent imminent or life-threatening deterioration. Critical care was time spent personally by me on the following activities: development of treatment plan with patient and/or surrogate as well  as nursing, discussions with consultants, evaluation of patient's response to treatment, examination of patient, obtaining history from patient or surrogate, ordering and performing treatments and interventions, ordering and review of laboratory studies, ordering and review of radiographic studies, pulse oximetry and re-evaluation of patient's condition.    Patient with rapid atrial fib.  He responded well to Cardizem drip.  He will be admitted to medicine with cardiology consult  Final Clinical Impressions(s) / ED Diagnoses   Final diagnoses:  Atrial fibrillation with RVR [facility])    ED Discharge Orders    None       Milton Ferguson, MD 01/11/18 1538

## 2018-01-12 ENCOUNTER — Encounter: Payer: Managed Care, Other (non HMO) | Admitting: Family Medicine

## 2018-01-12 ENCOUNTER — Inpatient Hospital Stay (HOSPITAL_COMMUNITY): Payer: Managed Care, Other (non HMO)

## 2018-01-12 DIAGNOSIS — R310 Gross hematuria: Secondary | ICD-10-CM | POA: Diagnosis present

## 2018-01-12 DIAGNOSIS — I482 Chronic atrial fibrillation: Secondary | ICD-10-CM | POA: Diagnosis present

## 2018-01-12 DIAGNOSIS — Z79899 Other long term (current) drug therapy: Secondary | ICD-10-CM | POA: Diagnosis not present

## 2018-01-12 DIAGNOSIS — I10 Essential (primary) hypertension: Secondary | ICD-10-CM

## 2018-01-12 DIAGNOSIS — N3289 Other specified disorders of bladder: Secondary | ICD-10-CM

## 2018-01-12 DIAGNOSIS — E7800 Pure hypercholesterolemia, unspecified: Secondary | ICD-10-CM | POA: Diagnosis present

## 2018-01-12 DIAGNOSIS — G4733 Obstructive sleep apnea (adult) (pediatric): Secondary | ICD-10-CM

## 2018-01-12 DIAGNOSIS — E1122 Type 2 diabetes mellitus with diabetic chronic kidney disease: Secondary | ICD-10-CM | POA: Diagnosis present

## 2018-01-12 DIAGNOSIS — I361 Nonrheumatic tricuspid (valve) insufficiency: Secondary | ICD-10-CM | POA: Diagnosis not present

## 2018-01-12 DIAGNOSIS — Z7982 Long term (current) use of aspirin: Secondary | ICD-10-CM | POA: Diagnosis not present

## 2018-01-12 DIAGNOSIS — N183 Chronic kidney disease, stage 3 (moderate): Secondary | ICD-10-CM | POA: Diagnosis present

## 2018-01-12 DIAGNOSIS — I340 Nonrheumatic mitral (valve) insufficiency: Secondary | ICD-10-CM

## 2018-01-12 DIAGNOSIS — D494 Neoplasm of unspecified behavior of bladder: Secondary | ICD-10-CM | POA: Diagnosis present

## 2018-01-12 DIAGNOSIS — I129 Hypertensive chronic kidney disease with stage 1 through stage 4 chronic kidney disease, or unspecified chronic kidney disease: Secondary | ICD-10-CM | POA: Diagnosis present

## 2018-01-12 DIAGNOSIS — E785 Hyperlipidemia, unspecified: Secondary | ICD-10-CM | POA: Diagnosis present

## 2018-01-12 DIAGNOSIS — I4891 Unspecified atrial fibrillation: Secondary | ICD-10-CM | POA: Diagnosis present

## 2018-01-12 DIAGNOSIS — D649 Anemia, unspecified: Secondary | ICD-10-CM | POA: Diagnosis present

## 2018-01-12 DIAGNOSIS — Z9049 Acquired absence of other specified parts of digestive tract: Secondary | ICD-10-CM | POA: Diagnosis not present

## 2018-01-12 DIAGNOSIS — N3091 Cystitis, unspecified with hematuria: Secondary | ICD-10-CM

## 2018-01-12 DIAGNOSIS — Z85038 Personal history of other malignant neoplasm of large intestine: Secondary | ICD-10-CM | POA: Diagnosis not present

## 2018-01-12 LAB — CBC
HEMATOCRIT: 26.8 % — AB (ref 39.0–52.0)
Hemoglobin: 8.3 g/dL — ABNORMAL LOW (ref 13.0–17.0)
MCH: 23.6 pg — ABNORMAL LOW (ref 26.0–34.0)
MCHC: 31 g/dL (ref 30.0–36.0)
MCV: 76.1 fL — ABNORMAL LOW (ref 78.0–100.0)
Platelets: 253 10*3/uL (ref 150–400)
RBC: 3.52 MIL/uL — AB (ref 4.22–5.81)
RDW: 15 % (ref 11.5–15.5)
WBC: 7.4 10*3/uL (ref 4.0–10.5)

## 2018-01-12 LAB — BASIC METABOLIC PANEL
Anion gap: 8 (ref 5–15)
BUN: 20 mg/dL (ref 6–20)
CHLORIDE: 109 mmol/L (ref 101–111)
CO2: 25 mmol/L (ref 22–32)
CREATININE: 1.28 mg/dL — AB (ref 0.61–1.24)
Calcium: 8.7 mg/dL — ABNORMAL LOW (ref 8.9–10.3)
GFR calc non Af Amer: 54 mL/min — ABNORMAL LOW (ref >60)
Glucose, Bld: 94 mg/dL (ref 65–99)
POTASSIUM: 4.4 mmol/L (ref 3.5–5.1)
Sodium: 142 mmol/L (ref 135–145)

## 2018-01-12 LAB — ECHOCARDIOGRAM COMPLETE
HEIGHTINCHES: 69 in
Weight: 2543.93 oz

## 2018-01-12 LAB — TROPONIN I: Troponin I: <0.03 ng/mL (ref <0.03)

## 2018-01-12 LAB — MAGNESIUM: MAGNESIUM: 1.7 mg/dL (ref 1.7–2.4)

## 2018-01-12 MED ORDER — METOPROLOL TARTRATE 25 MG PO TABS
12.5000 mg | ORAL_TABLET | Freq: Two times a day (BID) | ORAL | Status: DC
  Administered 2018-01-12 – 2018-01-13 (×2): 12.5 mg via ORAL
  Filled 2018-01-12 (×2): qty 1

## 2018-01-12 MED ORDER — MAGNESIUM SULFATE 2 GM/50ML IV SOLN
2.0000 g | Freq: Once | INTRAVENOUS | Status: AC
  Administered 2018-01-12: 2 g via INTRAVENOUS
  Filled 2018-01-12: qty 50

## 2018-01-12 NOTE — Progress Notes (Signed)
PROGRESS NOTE  Benjamin Chaney DYN:[id number] DOB: 07-Sep-1946 DOA: 01/11/2018 PCP: Caren Macadam, MD  Brief History:  72 year old male with a history of CKD stage III, hypertension, hyperlipidemia, OSA, and atrial fibrillation who presented from the cardiology office when he was noted to have atrial fibrillation with RVR.  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  He had a CT of the abdomen and pelvis on 12/26/2017 which demonstrated a large mass contiguous with the dome of the bladder with the differential diagnosis including neoplasm.  In the emergency department, the patient was noted to have a heart rate in the 140s.  He was started on a diltiazem drip which has been weaned off.  He was switched to long-acting diltiazem which was titrated up to 300 milligrams daily.  The patient was seen by cardiology who recommended echocardiogram and titration of meds to improve rate control.  He continued to have HRs up to 120s with minimal exertion.    Assessment/Plan: Atrial fibrillation with RVR -Initially started on diltiazem drip which has been weaned off- -diltiazem CD titrated up to 300 mg daily--> resting heart rate improved, but still increased to 120s with minimal activity -Discontinue benazepril to allow room to titrate up diltiazem without hypertension  -Start metoprolol 12.5 mg twice daily -Echo--EF 55-60%, no WMA, mild MR, small pericardial effusion -TSH 1.330 -3/15--case discussed with Dr. Domenic Polite -3/15--telemetry reviewed--remains in Afib without pauses -CHADS-VASc 2 - pt refuses anticoagulation, but poor candidate presently due to hematuria  CKD stage III -Baseline creatinine 1.3-1.6 -Monitor with serial BMP  Hematuria/bladder tumor -Patient canceled his appointment at [facility] for cystoscopy on 01/18/18 -he wants to switch care to [facility] -Monitor hemoglobin--am CBC -Baseline  hemoglobin ~9  Essential hypertension -Continue diltiazem -Holding Cardura to allow room for titration of diltiazem -Discontinue benazepril  Colon cancer -Status post left hemicolectomy    Disposition Plan:   Home in 1-2 days when HR control improves Family Communication:   Spouse updated at bedside 3/15--Total time spent 35 minutes.  Greater than 50% spent face to face counseling and coordinating care.  Consultants:  cardiology  Code Status:  FULL  DVT Prophylaxis:  SCDs   Procedures: As Listed in Progress Note Above  Antibiotics: None    Subjective: Patient denies fevers, chills, headache, chest pain, dyspnea, nausea, vomiting, diarrhea, abdominal pain, dysuria,  hematochezia, and melena.  Patient continues to have hematuria   Objective: Vitals:   01/11/18 2000 01/11/18 2143 01/12/18 0605 01/12/18 1457  BP:  118/64 120/61 118/70  Pulse:  68 64 67  Resp:  20 20 18   Temp:  98.2 F (36.8 C) 98.5 F (36.9 C) 99.9 F (37.7 C)  TempSrc:  Oral Oral Oral  SpO2: 94% 95% 95% 100%  Weight:   72.1 kg (158 lb 15.9 oz)   Height:        Intake/Output Summary (Last 24 hours) at 01/12/2018 1726 Last data filed at 01/12/2018 1458 Gross per 24 hour  Intake 392.5 ml  Output 1275 ml  Net -882.5 ml   Weight change:  Exam:   General:  Pt is alert, follows commands appropriately, not in acute distress  HEENT: No icterus, No thrush, No neck mass, [place]/AT  Cardiovascular: IRRR, S1/S2, no rubs, no gallops  Respiratory: Clear to auscultation no wheezing.  Abdomen: Soft/+BS, non tender, non distended, no guarding  Extremities: No edema, No lymphangitis,  No petechiae, No rashes, no synovitis   Data Reviewed: I have personally reviewed following labs and imaging studies Basic Metabolic Panel: Recent Labs  Lab 01/11/18 1056 01/12/18 0447 01/12/18 0824  NA 140 142  --   K 4.0 4.4  --   CL 106 109  --   CO2 26 25  --   GLUCOSE 114* 94  --   BUN 20 20  --     CREATININE 1.35* 1.28*  --   CALCIUM 9.2 8.7*  --   MG  --   --  1.7   Liver Function Tests: Recent Labs  Lab 01/11/18 1056  AST 14*  ALT 12*  ALKPHOS 71  BILITOT 0.6  PROT 5.9*  ALBUMIN 3.0*   No results for input(s): LIPASE, AMYLASE in the last 168 hours. No results for input(s): AMMONIA in the last 168 hours. Coagulation Profile: No results for input(s): INR, PROTIME in the last 168 hours. CBC: Recent Labs  Lab 01/11/18 1056 01/12/18 0447  WBC 8.0 7.4  NEUTROABS 5.6  --   HGB 8.8* 8.3*  HCT 28.1* 26.8*  MCV 75.9* 76.1*  PLT 249 253   Cardiac Enzymes: Recent Labs  Lab 01/11/18 1056 01/11/18 1648 01/11/18 2211 01/12/18 0447  TROPONINI <0.03 <0.03 <0.03 <0.03   BNP: Invalid input(s): POCBNP CBG: No results for input(s): GLUCAP in the last 168 hours. HbA1C: No results for input(s): HGBA1C in the last 72 hours. Urine analysis:    Component Value Date/Time   COLORURINE YELLOW 12/26/2017 0817   APPEARANCEUR CLOUDY (A) 12/26/2017 0817   LABSPEC 1.011 12/26/2017 0817   PHURINE 5.0 12/26/2017 0817   GLUCOSEU NEGATIVE 12/26/2017 0817   HGBUR MODERATE (A) 12/26/2017 0817   BILIRUBINUR NEGATIVE 12/26/2017 0817   BILIRUBINUR neg 12/05/2017 1451   KETONESUR NEGATIVE 12/26/2017 0817   PROTEINUR 100 (A) 12/26/2017 0817   UROBILINOGEN 1.0 12/05/2017 1451   NITRITE NEGATIVE 12/26/2017 0817   LEUKOCYTESUR MODERATE (A) 12/26/2017 0817   Sepsis Labs: @LABRCNTIP (procalcitonin:4,lacticidven:4) )No results found for this or any previous visit (from the past 240 hour(s)).   Scheduled Meds: . aspirin  325 mg Oral Daily  . diltiazem  300 mg Oral Daily  . dutasteride  0.5 mg Oral Daily  . sodium chloride flush  3 mL Intravenous Q12H   Continuous Infusions: . sodium chloride      Procedures/Studies: Dg Chest 2 View  Result Date: 01/11/2018 CLINICAL DATA:  Weakness EXAM: CHEST - 2 VIEW COMPARISON:  None. FINDINGS: Heart size upper normal. Negative for heart  failure. Lungs are clear without infiltrate effusion or mass. No acute skeletal abnormality. IMPRESSION: No active cardiopulmonary disease. Electronically Signed   By: Franchot Gallo M.D.   On: 01/11/2018 11:27   Ct Abdomen Pelvis W Contrast  Result Date: 12/26/2017 CLINICAL DATA:  LOSS OF APPETITE WITH DIARRHEA X 1 MONTH. HX COLON POLYPS REMOVED. ^51mL ISOVUE-300 IOPAMIDOL (ISOVUE-300) INJECTION 61% EXAM: CT ABDOMEN AND PELVIS WITH CONTRAST TECHNIQUE: Multidetector CT imaging of the abdomen and pelvis was performed using the standard protocol following bolus administration of intravenous contrast. CONTRAST:  69mL ISOVUE-300 IOPAMIDOL (ISOVUE-300) INJECTION 61% COMPARISON:  None. FINDINGS: Lower chest: Lung bases are clear. Hepatobiliary: Small hepatic cysts.  Gallbladder Pancreas: Pancreatic duct is prominent but felt normal limits. No mass or. Spleen: Normal spleen Adrenals/urinary tract: Adrenal glands are normal. Small bilateral renal cysts noted. Ureters grossly There is a large mass contiguous with the dome of the bladder extending out of pelvis the level of  the umbilicus. Mass measures 11.5 by 12.7 12.0 cm (volume = 918 cm^3). Mass compresses the bladder a portion of compressed bladder extending into the RIGHT iliac fossa (image 50, series 2) Mass relatively homogeneous with internal enhancement and vascularity. On sagittal projection difficulty cell in mass arises from the dome of the bladder wall or compresses the dome. Bladder wall enhancing adjacent mass Stomach/Bowel: Stomach duodenum small-bowel normal. No obstruction. Loop of small bowel does enter ventral hernia without evidence obstruction. Hernia superior to the umbilicus (image 30, series Vascular/Lymphatic: No evidence of pelvic lymphadenopathy. No periaortic adenopathy calcification Reproductive: Prostate gland is enlarged the base bladder measuring 5.7 cm in diameter Other: No free fluid. Musculoskeletal: No aggressive osseous lesion.  IMPRESSION: 1. 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 Difficult to determine if lesion compresses or arises from the bladder or both. Differential would include primary bladder wall neoplasm, atypical urachal carcinoma, GI stromal tumor, sarcoma or lymphoma. 2. Ventral abdominal hernia contains nonobstructed loops small bowel. 3. Prostate hypertrophy. Electronically Signed   By: Suzy Bouchard M.D.   On: 12/26/2017 10:26    Orson Eva, DO  [facility] Pager [phone]  If 7PM-7AM, please contact night-coverage [email or web address] Password [email or web address] 01/12/2018, 5:26 PM   LOS: 0 days

## 2018-01-12 NOTE — Progress Notes (Signed)
*  PRELIMINARY RESULTS* Echocardiogram 2D Echocardiogram has been performed.  Benjamin Chaney 01/12/2018, 2:11 PM

## 2018-01-12 NOTE — Progress Notes (Addendum)
Progress Note  Patient Name: Benjamin Chaney Date of Encounter: 01/12/2018  Primary Cardiologist: Kate Sable, MD   Subjective   Denies any palpitations or dyspnea. Reports he was asymptomatic when evaluated in the office yesterday and is very anxious to go home today.  Inpatient Medications    Scheduled Meds: . aspirin  325 mg Oral Daily  . benazepril  40 mg Oral Daily  . diltiazem  300 mg Oral Daily  . diltiazem  60 mg Oral Q8H  . doxazosin  4 mg Oral Daily  . dutasteride  0.5 mg Oral Daily  . sodium chloride flush  3 mL Intravenous Q12H   Continuous Infusions: . sodium chloride     PRN Meds: sodium chloride, acetaminophen, ondansetron (ZOFRAN) IV, sodium chloride flush   Vital Signs    Vitals:   01/11/18 1854 01/11/18 2000 01/11/18 2143 01/12/18 0605  BP:   118/64 120/61  Pulse:   68 64  Resp:   20 20  Temp:   98.2 F (36.8 C) 98.5 F (36.9 C)  TempSrc:   Oral Oral  SpO2:  94% 95% 95%  Weight: 159 lb (72.1 kg)   158 lb 15.9 oz (72.1 kg)  Height:        Intake/Output Summary (Last 24 hours) at 01/12/2018 0754 Last data filed at 01/12/2018 0643 Gross per 24 hour  Intake 392.5 ml  Output 825 ml  Net -432.5 ml   Filed Weights   01/11/18 1025 01/11/18 1854 01/12/18 0605  Weight: 162 lb (73.5 kg) 159 lb (72.1 kg) 158 lb 15.9 oz (72.1 kg)    Telemetry    Atrial fibrillation, HR in 70's to 90's, peaking into 130's. Occasional pauses up to 2.31 seconds - Personally Reviewed  ECG    No new tracings.  Physical Exam   General: Well developed, well nourished African American male appearing in no acute distress. Head: Normocephalic, atraumatic.  Neck: Supple without bruits, JVD not elevated. Lungs:  Resp regular and unlabored, CTA without wheezing or rales. Heart: Irregularly irregular, S1, S2, no S3, S4, or murmur; no rub. Abdomen: Soft, non-tender, non-distended with normoactive bowel sounds. No hepatomegaly. No rebound/guarding. No obvious  abdominal masses. Extremities: No clubbing, cyanosis, or lower extremity edema. Distal pedal pulses are 2+ bilaterally. Neuro: Alert and oriented X 3. Moves all extremities spontaneously. Psych: Normal affect.  Labs    Chemistry Recent Labs  Lab 01/11/18 1056 01/12/18 0447  NA 140 142  K 4.0 4.4  CL 106 109  CO2 26 25  GLUCOSE 114* 94  BUN 20 20  CREATININE 1.35* 1.28*  CALCIUM 9.2 8.7*  PROT 5.9*  --   ALBUMIN 3.0*  --   AST 14*  --   ALT 12*  --   ALKPHOS 71  --   BILITOT 0.6  --   GFRNONAA 50* 54*  GFRAA 59* >60  ANIONGAP 8 8     Hematology Recent Labs  Lab 01/11/18 1056 01/12/18 0447  WBC 8.0 7.4  RBC 3.70* 3.52*  HGB 8.8* 8.3*  HCT 28.1* 26.8*  MCV 75.9* 76.1*  MCH 23.8* 23.6*  MCHC 31.3 31.0  RDW 14.8 15.0  PLT 249 253    Cardiac Enzymes Recent Labs  Lab 01/11/18 1056 01/11/18 1648 01/11/18 2211 01/12/18 0447  TROPONINI <0.03 <0.03 <0.03 <0.03   No results for input(s): TROPIPOC in the last 168 hours.   BNPNo results for input(s): BNP, PROBNP in the last 168 hours.   DDimer No  results for input(s): DDIMER in the last 168 hours.   Radiology    Dg Chest 2 View  Result Date: 01/11/2018 CLINICAL DATA:  Weakness EXAM: CHEST - 2 VIEW COMPARISON:  None. FINDINGS: Heart size upper normal. Negative for heart failure. Lungs are clear without infiltrate effusion or mass. No acute skeletal abnormality. IMPRESSION: No active cardiopulmonary disease. Electronically Signed   By: Franchot Gallo M.D.   On: 01/11/2018 11:27    Cardiac Studies   Echo: Pending  Patient Profile     72 y.o. male w/ PMH of paroxysmal atrial fibrillation (noted on prior EKG's from 2015), HTN, OSA, and recently diagnosed bladder tumor (concern for malignancy - awaiting further evaluation) who presented to the office on 01/11/2018 and was noted to be in atrial fibrillation with RVR. Therefore, he was referred to the ED and admitted for further treatment.  Assessment & Plan      1. Atrial Fibrillation with RVR - Heart rate was noted to be in the 130's-140's during his recent office visit.  He was a symptomatic with this and reports having atrial fibrillation for 30+ years. - Initial labs showed Hgb 8.8, Na+ 140, K+ 4.0, and TSH 1.330.  We will add on Mg. Cyclic troponin values have been negative. - Heart rate has been in the 70's-90's overnight, peaking into the 130's with activity. Received short acting Cardizem at 0630 and is scheduled to receive Cardizem CD 300 mg daily at 1000 (on 240mg  daily PTA). Would hold prior to admission Benzapril. Can consider the addition of Lopressor if additional rate control is needed. Would ambulate later today to assess heart rate with activity. - Echocardiogram was recommended but not yet ordered. Will place order for this now as no recent echo is on file. - This patients CHA2DS2-VASc Score and unadjusted Ischemic Stroke Rate (% per year) is equal to 2.2 % stroke rate/year from a score of 2 (HTN, Age). The patient has declined anticoagulation in the past and has remained on 325 mg ASA. Not felt to be a candidate for anticoagulation given his anemia, active hematuria, and concern for bladder neoplasm.   2. HTN - BP has been variable at 104/61-150/99 since admission. Stable at 120/61 on most recent check. - He is currently receiving Benzapril 40 mg daily and Cardizem CD (increased from 240mg  to 300mg  daily on admission). Will stop Benzapril for now to allow for further titration of AV nodal blocking agents.  3. Stage 3 CKD - creatinine stable at 1.28 (previously 1.5 - 1.6 two months prior).   4. Hematuria/Anemia - Hgb stable at 8.3 this AM. Noted to have hematuria in bedside urinal.   5. Bladder Tumor - Reports this was just diagnosed 2 weeks ago. Has been referred to [facility] as an outpatient.   For questions or updates, please contact [place] Please consult [email or web address] for contact info under  Cardiology/STEMI.   Arna Medici , PA-C 7:54 AM 01/12/2018 Pager: [phone]   Attending note:  Patient seen and examined.  I reviewed records including office note from Dr. Bronson Ing yesterday, discussed the case with Ms. Delano Metz.  Mr. Strauch is admitted with rapid atrial fibrillation that by his account has been asymptomatic in terms of palpitations and shortness of breath.  He states that he has had atrial fibrillation for "40 years" and had previously declined anticoagulation.  Complicating his clinical picture is a bladder tumor with pending assessment by [facility].  He has active hematuria associated with this  and hemoglobin down to 8.3.   Overnight patient's heart rate at rest was in the 70s-90s, however with activity, even moving around in his room it goes up into the 130s.  I personally reviewed his telemetry which shows atrial fibrillation.  On examination this morning he appears comfortable, lungs are clear without wheezing or crackles.  Cardiac exam reveals irregularly irregular rhythm without gallop.  Lab work shows normal troponin I levels, potassium 4.4, creatinine 1.28, hemoglobin 8.3, platelets 253.  Chest x-ray reports no acute process.  I personally reviewed his ECG from 01/11/2018 which shows atrial fibrillation with decreased R wave progression.  Atrial fibrillation with RVR, reportedly chronic history of atrial fibrillation. CHADSVASC score is 2.  Patient has declined anticoagulation previously, and is not a good candidate for this anyway in light of bladder tumor with active hematuria at this time.  He states that he does not want to be in the hospital and plans to go home today.  We have ordered an echocardiogram to assess for potential tachycardia-mediated cardiomyopathy, I discussed this with him.  His Cardizem CD has also been up titrated.  Agree with stopping ACE inhibitor for now to allow additional blood pressure room for further AV nodal  blockers if needed.  If he does decide to leave today, he will need to have outpatient follow-up arranged for continued adjustment in medications.  Satira Sark, M.D., F.A.C.C.

## 2018-01-12 NOTE — Progress Notes (Signed)
Benjamin Bowens, MD notified via telephone of patient's repeated episodes of heartbeat pauses per tele. Patient asymptomatic, denies chest pain. No new orders. Will continue to monitor.

## 2018-01-12 NOTE — Progress Notes (Signed)
Blood noted in patient's urine,Dr Tat notified. Will continue to monitor patient.

## 2018-01-13 LAB — CBC
HCT: 28.2 % — ABNORMAL LOW (ref 39.0–52.0)
HEMOGLOBIN: 8.6 g/dL — AB (ref 13.0–17.0)
MCH: 23.1 pg — AB (ref 26.0–34.0)
MCHC: 30.5 g/dL (ref 30.0–36.0)
MCV: 75.8 fL — ABNORMAL LOW (ref 78.0–100.0)
Platelets: 273 10*3/uL (ref 150–400)
RBC: 3.72 MIL/uL — ABNORMAL LOW (ref 4.22–5.81)
RDW: 14.9 % (ref 11.5–15.5)
WBC: 7.5 10*3/uL (ref 4.0–10.5)

## 2018-01-13 LAB — BASIC METABOLIC PANEL
Anion gap: 8 (ref 5–15)
BUN: 17 mg/dL (ref 6–20)
CO2: 25 mmol/L (ref 22–32)
Calcium: 8.7 mg/dL — ABNORMAL LOW (ref 8.9–10.3)
Chloride: 106 mmol/L (ref 101–111)
Creatinine, Ser: 1.33 mg/dL — ABNORMAL HIGH (ref 0.61–1.24)
GFR calc Af Amer: 60 mL/min — ABNORMAL LOW (ref >60)
GFR, EST NON AFRICAN AMERICAN: 51 mL/min — AB (ref >60)
GLUCOSE: 100 mg/dL — AB (ref 65–99)
POTASSIUM: 3.8 mmol/L (ref 3.5–5.1)
Sodium: 139 mmol/L (ref 135–145)

## 2018-01-13 LAB — MAGNESIUM: Magnesium: 2.1 mg/dL (ref 1.7–2.4)

## 2018-01-13 MED ORDER — POTASSIUM CHLORIDE CRYS ER 20 MEQ PO TBCR
20.0000 meq | EXTENDED_RELEASE_TABLET | Freq: Once | ORAL | Status: AC
  Administered 2018-01-13: 20 meq via ORAL
  Filled 2018-01-13: qty 1

## 2018-01-13 MED ORDER — DILTIAZEM HCL ER COATED BEADS 180 MG PO CP24
360.0000 mg | ORAL_CAPSULE | Freq: Every day | ORAL | Status: DC
  Administered 2018-01-14 – 2018-01-15 (×2): 360 mg via ORAL
  Filled 2018-01-13 (×2): qty 2

## 2018-01-13 MED ORDER — METOPROLOL TARTRATE 25 MG PO TABS
25.0000 mg | ORAL_TABLET | Freq: Two times a day (BID) | ORAL | Status: DC
  Administered 2018-01-13: 25 mg via ORAL
  Filled 2018-01-13 (×2): qty 1

## 2018-01-13 MED ORDER — METOPROLOL TARTRATE 25 MG PO TABS
12.5000 mg | ORAL_TABLET | Freq: Once | ORAL | Status: AC
Start: 2018-01-13 — End: 2018-01-13
  Administered 2018-01-13: 12.5 mg via ORAL
  Filled 2018-01-13: qty 1

## 2018-01-13 MED ORDER — METOPROLOL TARTRATE 25 MG PO TABS: 12.5000 mg | ORAL_TABLET | Freq: Once | ORAL | Status: DC

## 2018-01-13 NOTE — Progress Notes (Signed)
PROGRESS NOTE  Benjamin Chaney GHW:[id number] DOB: 05/03/1946 DOA: 01/11/2018 PCP: Caren Macadam, MD  Brief History:  72 year old male with a history of CKD stage III, hypertension, hyperlipidemia, OSA, and atrial fibrillation who presented from the cardiology office when he was noted to have atrial fibrillation with RVR. 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 He had a CT of the abdomen and pelvis on 12/26/2017 which demonstrated a large mass contiguous with the dome of the bladder with the differential diagnosis including neoplasm.In the emergency department, the patient was noted to have a heart rate in the 140s. He was started on a diltiazem drip which has been weaned off. He was switched to long-acting diltiazem which was titrated up to 360milligrams daily. The patient was seen by cardiology who recommended echocardiogram and titration of meds to improve rate control.  He continued to have HRs up to 120s with minimal exertion.    Assessment/Plan: Atrial fibrillation with RVR -Initially started on diltiazem drip which has been weaned off- -diltiazemCDtitrated up to 300 mg daily--> resting heart rate improved, but still increased to 110s with minimal activity -Discontinue benazepril to allow room to titrate up diltiazem without hypertension  -Increase metoprolol 25 mg twice daily as BP allows -Echo--EF 55-60%, no WMA, mild MR, small pericardial effusion -TSH 1.330 -3/15--case discussed with Dr. Domenic Polite -3/16--telemetry personally reviewed--remains in Afib without pauses with episodes RVR 120s overnight -CHADS-VASc 2 - pt refuses anticoagulation, but poor candidate presently due to hematuria  CKD stage III -Baseline creatinine 1.3-1.6 -Monitor with serial BMP  Hematuria/bladder tumor -Patient canceled his appointment [facility] for cystoscopy on 01/18/18 -he wants to switch care to  [facility] -Monitor hemoglobin--am CBC -Baseline hemoglobin~9  Essential hypertension -Continue diltiazem -Holding Cardura to allow room for titration of diltiazem -Discontinue benazepril  Colon cancer -Status post left hemicolectomy    Disposition Plan:   Home in 1-2 days when HR control improves Family Communication:   Spouse updated at bedside 3/15  Consultants:  cardiology  Code Status:  FULL  DVT Prophylaxis:  SCDs   Procedures: As Listed in Progress Note Above  Antibiotics: None     Subjective: Patient denies fevers, chills, headache, chest pain, dyspnea, nausea, vomiting, diarrhea, abdominal pain, dysuria, hematuria, hematochezia, and melena.   Objective: Vitals:   01/12/18 1457 01/12/18 2136 01/12/18 2202 01/13/18 0455  BP: 118/70 (!) 103/59  116/66  Pulse: 67 76  66  Resp: 18     Temp: 99.9 F (37.7 C) 99 F (37.2 C)  98.6 F (37 C)  TempSrc: Oral Oral  Oral  SpO2: 100% 98% 98% 97%  Weight:    70.8 kg (156 lb 1.4 oz)  Height:        Intake/Output Summary (Last 24 hours) at 01/13/2018 1143 Last data filed at 01/13/2018 0456 Gross per 24 hour  Intake 480 ml  Output 925 ml  Net -445 ml   Weight change: -2.683 kg (-14.6 oz) Exam:   General:  Pt is alert, follows commands appropriately, not in acute distress  HEENT: No icterus, No thrush, No neck mass, [place]/AT  Cardiovascular: IRRR, S1/S2, no rubs, no gallops  Respiratory: CTA bilaterally, no wheezing, no crackles, no rhonchi  Abdomen: Soft/+BS, non tender, non distended, no guarding  Extremities: No edema, No lymphangitis, No petechiae, No rashes, no synovitis   Data Reviewed: I have personally reviewed following labs and  imaging studies Basic Metabolic Panel: Recent Labs  Lab 01/11/18 1056 01/12/18 0447 01/12/18 0824 01/13/18 0630  NA 140 142  --  139  K 4.0 4.4  --  3.8  CL 106 109  --  106  CO2 26 25  --  25  GLUCOSE 114* 94  --  100*  BUN 20 20  --  17    CREATININE 1.35* 1.28*  --  1.33*  CALCIUM 9.2 8.7*  --  8.7*  MG  --   --  1.7 2.1   Liver Function Tests: Recent Labs  Lab 01/11/18 1056  AST 14*  ALT 12*  ALKPHOS 71  BILITOT 0.6  PROT 5.9*  ALBUMIN 3.0*   No results for input(s): LIPASE, AMYLASE in the last 168 hours. No results for input(s): AMMONIA in the last 168 hours. Coagulation Profile: No results for input(s): INR, PROTIME in the last 168 hours. CBC: Recent Labs  Lab 01/11/18 1056 01/12/18 0447 01/13/18 0630  WBC 8.0 7.4 7.5  NEUTROABS 5.6  --   --   HGB 8.8* 8.3* 8.6*  HCT 28.1* 26.8* 28.2*  MCV 75.9* 76.1* 75.8*  PLT 249 253 273   Cardiac Enzymes: Recent Labs  Lab 01/11/18 1056 01/11/18 1648 01/11/18 2211 01/12/18 0447  TROPONINI <0.03 <0.03 <0.03 <0.03   BNP: Invalid input(s): POCBNP CBG: No results for input(s): GLUCAP in the last 168 hours. HbA1C: No results for input(s): HGBA1C in the last 72 hours. Urine analysis:    Component Value Date/Time   COLORURINE YELLOW 12/26/2017 0817   APPEARANCEUR CLOUDY (A) 12/26/2017 0817   LABSPEC 1.011 12/26/2017 0817   PHURINE 5.0 12/26/2017 0817   GLUCOSEU NEGATIVE 12/26/2017 0817   HGBUR MODERATE (A) 12/26/2017 0817   BILIRUBINUR NEGATIVE 12/26/2017 0817   BILIRUBINUR neg 12/05/2017 1451   KETONESUR NEGATIVE 12/26/2017 0817   PROTEINUR 100 (A) 12/26/2017 0817   UROBILINOGEN 1.0 12/05/2017 1451   NITRITE NEGATIVE 12/26/2017 0817   LEUKOCYTESUR MODERATE (A) 12/26/2017 0817   Sepsis Labs: @LABRCNTIP (procalcitonin:4,lacticidven:4) )No results found for this or any previous visit (from the past 240 hour(s)).   Scheduled Meds: . aspirin  325 mg Oral Daily  . [START ON 01/14/2018] diltiazem  360 mg Oral Daily  . dutasteride  0.5 mg Oral Daily  . metoprolol tartrate  12.5 mg Oral BID  . sodium chloride flush  3 mL Intravenous Q12H   Continuous Infusions: . sodium chloride      Procedures/Studies: Dg Chest 2 View  Result Date:  01/11/2018 CLINICAL DATA:  Weakness EXAM: CHEST - 2 VIEW COMPARISON:  None. FINDINGS: Heart size upper normal. Negative for heart failure. Lungs are clear without infiltrate effusion or mass. No acute skeletal abnormality. IMPRESSION: No active cardiopulmonary disease. Electronically Signed   By: Franchot Gallo M.D.   On: 01/11/2018 11:27   Ct Abdomen Pelvis W Contrast  Result Date: 12/26/2017 CLINICAL DATA:  LOSS OF APPETITE WITH DIARRHEA X 1 MONTH. HX COLON POLYPS REMOVED. ^53mL ISOVUE-300 IOPAMIDOL (ISOVUE-300) INJECTION 61% EXAM: CT ABDOMEN AND PELVIS WITH CONTRAST TECHNIQUE: Multidetector CT imaging of the abdomen and pelvis was performed using the standard protocol following bolus administration of intravenous contrast. CONTRAST:  20mL ISOVUE-300 IOPAMIDOL (ISOVUE-300) INJECTION 61% COMPARISON:  None. FINDINGS: Lower chest: Lung bases are clear. Hepatobiliary: Small hepatic cysts.  Gallbladder Pancreas: Pancreatic duct is prominent but felt normal limits. No mass or. Spleen: Normal spleen Adrenals/urinary tract: Adrenal glands are normal. Small bilateral renal cysts noted. Ureters grossly There is a  large mass contiguous with the dome of the bladder extending out of pelvis the level of the umbilicus. Mass measures 11.5 by 12.7 12.0 cm (volume = 918 cm^3). Mass compresses the bladder a portion of compressed bladder extending into the RIGHT iliac fossa (image 50, series 2) Mass relatively homogeneous with internal enhancement and vascularity. On sagittal projection difficulty cell in mass arises from the dome of the bladder wall or compresses the dome. Bladder wall enhancing adjacent mass Stomach/Bowel: Stomach duodenum small-bowel normal. No obstruction. Loop of small bowel does enter ventral hernia without evidence obstruction. Hernia superior to the umbilicus (image 30, series Vascular/Lymphatic: No evidence of pelvic lymphadenopathy. No periaortic adenopathy calcification Reproductive: Prostate gland is  enlarged the base bladder measuring 5.7 cm in diameter Other: No free fluid. Musculoskeletal: No aggressive osseous lesion. IMPRESSION: 1. 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 Difficult to determine if lesion compresses or arises from the bladder or both. Differential would include primary bladder wall neoplasm, atypical urachal carcinoma, GI stromal tumor, sarcoma or lymphoma. 2. Ventral abdominal hernia contains nonobstructed loops small bowel. 3. Prostate hypertrophy. Electronically Signed   By: Suzy Bouchard M.D.   On: 12/26/2017 10:26    Orson Eva, DO  [facility] Pager [phone]  If 7PM-7AM, please contact night-coverage [email or web address] Password [email or web address] 01/13/2018, 11:43 AM   LOS: 1 day

## 2018-01-14 LAB — BASIC METABOLIC PANEL
Anion gap: 7 (ref 5–15)
BUN: 20 mg/dL (ref 6–20)
CALCIUM: 9 mg/dL (ref 8.9–10.3)
CO2: 25 mmol/L (ref 22–32)
Chloride: 108 mmol/L (ref 101–111)
Creatinine, Ser: 1.39 mg/dL — ABNORMAL HIGH (ref 0.61–1.24)
GFR, EST AFRICAN AMERICAN: 56 mL/min — AB (ref >60)
GFR, EST NON AFRICAN AMERICAN: 49 mL/min — AB (ref >60)
Glucose, Bld: 104 mg/dL — ABNORMAL HIGH (ref 65–99)
Potassium: 4.1 mmol/L (ref 3.5–5.1)
SODIUM: 140 mmol/L (ref 135–145)

## 2018-01-14 MED ORDER — METOPROLOL TARTRATE 50 MG PO TABS
50.0000 mg | ORAL_TABLET | Freq: Two times a day (BID) | ORAL | Status: DC
  Administered 2018-01-14 – 2018-01-15 (×2): 50 mg via ORAL
  Filled 2018-01-14 (×3): qty 1

## 2018-01-14 NOTE — Progress Notes (Signed)
PROGRESS NOTE  Benjamin Chaney:[id number] DOB: 01-31-1946 DOA: 01/11/2018 PCP: Caren Macadam, MD  Brief History:  72 year old male with a history of CKD stage III, hypertension, hyperlipidemia, OSA, and atrial fibrillation who presented from the cardiology office when he was noted to have atrial fibrillation with RVR. 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 He had a CT of the abdomen and pelvis on 12/26/2017 which demonstrated a large mass contiguous with the dome of the bladder with the differential diagnosis including neoplasm.In the emergency department, the patient was noted to have a heart rate in the 140s. He was started on a diltiazem drip which has been weaned off. He was switched to long-acting diltiazem which was titrated up to 312milligrams daily. The patient was seen by cardiology who recommended echocardiogramand titration of meds to improve rate control. He continued to have HRs up to 120s with minimal exertion.    Assessment/Plan: Atrial fibrillation with RVR -Initially started on diltiazem drip which has been weaned off- -diltiazemCDtitrate up to 360 mg daily -Discontinue benazepril to allow room to titrate up diltiazem without hypertension -Increase metoprolol 50 mg twice daily as BP allows -Echo--EF 55-60%, no WMA, mild MR, small pericardial effusion -TSH 1.330 -3/17--telemetry personally reviewed--remains in Afib without pauses with episodes RVR 140s overnight intermittently -CHADS-VASc 2 - pt refuses anticoagulation, but poor candidate presently due to hematuria  CKD stage III -Baseline creatinine 1.3-1.6 -Monitor with serial BMP  Hematuria/bladder tumor -Patient canceled his appointment [facility] for cystoscopy on 01/18/18 -he wants to switch care to [facility] -Monitor hemoglobin--am CBC -Baseline hemoglobin~9  Essential hypertension -Continue  diltiazem -Holding Cardura to allow room for titration of diltiazem -Discontinue benazepril  Colon cancer -Status post left hemicolectomy    Disposition Plan: Home in 1-2dayswhen HR control improves Family Communication:Spouse updatedat bedside 3/15  Consultants:cardiology  Code Status: FULL  DVT Prophylaxis: SCDs   Procedures: As Listed in Progress Note Above  Antibiotics: None        Subjective: Patient denies fevers, chills, headache, chest pain, dyspnea, nausea, vomiting, diarrhea, abdominal pain, dysuria, hematuria, hematochezia, and melena.   Objective: Vitals:   01/13/18 0455 01/13/18 1400 01/13/18 2152 01/14/18 0503  BP: 116/66 104/61 131/82 128/76  Pulse: 66 73 85 78  Resp:  16 16 16   Temp: 98.6 F (37 C) 98.6 F (37 C) 99.3 F (37.4 C) 98.5 F (36.9 C)  TempSrc: Oral Oral Oral Oral  SpO2: 97% (!) 16% 100% 100%  Weight: 70.8 kg (156 lb 1.4 oz)   68.3 kg (150 lb 8 oz)  Height:        Intake/Output Summary (Last 24 hours) at 01/14/2018 1038 Last data filed at 01/14/2018 0604 Gross per 24 hour  Intake 480 ml  Output 1700 ml  Net -1220 ml   Weight change: -2.534 kg (-9.4 oz) Exam:   General:  Pt is alert, follows commands appropriately, not in acute distress  HEENT: No icterus, No thrush, No neck mass, [place]/AT  Cardiovascular: IRRR, S1/S2, no rubs, no gallops  Respiratory: CTA bilaterally, no wheezing, no crackles, no rhonchi  Abdomen: Soft/+BS, non tender, non distended, no guarding  Extremities: No edema, No lymphangitis, No petechiae, No rashes, no synovitis   Data Reviewed: I have personally reviewed following labs and imaging studies Basic Metabolic Panel: Recent Labs  Lab 01/11/18 1056 01/12/18 0447 01/12/18 0824 01/13/18 0630 01/14/18 3614  NA 140 142  --  139 140  K 4.0 4.4  --  3.8 4.1  CL 106 109  --  106 108  CO2 26 25  --  25 25  GLUCOSE 114* 94  --  100* 104*  BUN 20 20  --  17 20    CREATININE 1.35* 1.28*  --  1.33* 1.39*  CALCIUM 9.2 8.7*  --  8.7* 9.0  MG  --   --  1.7 2.1  --    Liver Function Tests: Recent Labs  Lab 01/11/18 1056  AST 14*  ALT 12*  ALKPHOS 71  BILITOT 0.6  PROT 5.9*  ALBUMIN 3.0*   No results for input(s): LIPASE, AMYLASE in the last 168 hours. No results for input(s): AMMONIA in the last 168 hours. Coagulation Profile: No results for input(s): INR, PROTIME in the last 168 hours. CBC: Recent Labs  Lab 01/11/18 1056 01/12/18 0447 01/13/18 0630  WBC 8.0 7.4 7.5  NEUTROABS 5.6  --   --   HGB 8.8* 8.3* 8.6*  HCT 28.1* 26.8* 28.2*  MCV 75.9* 76.1* 75.8*  PLT 249 253 273   Cardiac Enzymes: Recent Labs  Lab 01/11/18 1056 01/11/18 1648 01/11/18 2211 01/12/18 0447  TROPONINI <0.03 <0.03 <0.03 <0.03   BNP: Invalid input(s): POCBNP CBG: No results for input(s): GLUCAP in the last 168 hours. HbA1C: No results for input(s): HGBA1C in the last 72 hours. Urine analysis:    Component Value Date/Time   COLORURINE YELLOW 12/26/2017 0817   APPEARANCEUR CLOUDY (A) 12/26/2017 0817   LABSPEC 1.011 12/26/2017 0817   PHURINE 5.0 12/26/2017 0817   GLUCOSEU NEGATIVE 12/26/2017 0817   HGBUR MODERATE (A) 12/26/2017 0817   BILIRUBINUR NEGATIVE 12/26/2017 0817   BILIRUBINUR neg 12/05/2017 1451   KETONESUR NEGATIVE 12/26/2017 0817   PROTEINUR 100 (A) 12/26/2017 0817   UROBILINOGEN 1.0 12/05/2017 1451   NITRITE NEGATIVE 12/26/2017 0817   LEUKOCYTESUR MODERATE (A) 12/26/2017 0817   Sepsis Labs: @LABRCNTIP (procalcitonin:4,lacticidven:4) )No results found for this or any previous visit (from the past 240 hour(s)).   Scheduled Meds: . aspirin  325 mg Oral Daily  . diltiazem  360 mg Oral Daily  . dutasteride  0.5 mg Oral Daily  . metoprolol tartrate  50 mg Oral BID  . sodium chloride flush  3 mL Intravenous Q12H   Continuous Infusions: . sodium chloride      Procedures/Studies: Dg Chest 2 View  Result Date: 01/11/2018 CLINICAL  DATA:  Weakness EXAM: CHEST - 2 VIEW COMPARISON:  None. FINDINGS: Heart size upper normal. Negative for heart failure. Lungs are clear without infiltrate effusion or mass. No acute skeletal abnormality. IMPRESSION: No active cardiopulmonary disease. Electronically Signed   By: Franchot Gallo M.D.   On: 01/11/2018 11:27   Ct Abdomen Pelvis W Contrast  Result Date: 12/26/2017 CLINICAL DATA:  LOSS OF APPETITE WITH DIARRHEA X 1 MONTH. HX COLON POLYPS REMOVED. ^36mL ISOVUE-300 IOPAMIDOL (ISOVUE-300) INJECTION 61% EXAM: CT ABDOMEN AND PELVIS WITH CONTRAST TECHNIQUE: Multidetector CT imaging of the abdomen and pelvis was performed using the standard protocol following bolus administration of intravenous contrast. CONTRAST:  80mL ISOVUE-300 IOPAMIDOL (ISOVUE-300) INJECTION 61% COMPARISON:  None. FINDINGS: Lower chest: Lung bases are clear. Hepatobiliary: Small hepatic cysts.  Gallbladder Pancreas: Pancreatic duct is prominent but felt normal limits. No mass or. Spleen: Normal spleen Adrenals/urinary tract: Adrenal glands are normal. Small bilateral renal cysts noted. Ureters grossly There is a large mass contiguous with the dome of the bladder extending  out of pelvis the level of the umbilicus. Mass measures 11.5 by 12.7 12.0 cm (volume = 918 cm^3). Mass compresses the bladder a portion of compressed bladder extending into the RIGHT iliac fossa (image 50, series 2) Mass relatively homogeneous with internal enhancement and vascularity. On sagittal projection difficulty cell in mass arises from the dome of the bladder wall or compresses the dome. Bladder wall enhancing adjacent mass Stomach/Bowel: Stomach duodenum small-bowel normal. No obstruction. Loop of small bowel does enter ventral hernia without evidence obstruction. Hernia superior to the umbilicus (image 30, series Vascular/Lymphatic: No evidence of pelvic lymphadenopathy. No periaortic adenopathy calcification Reproductive: Prostate gland is enlarged the base  bladder measuring 5.7 cm in diameter Other: No free fluid. Musculoskeletal: No aggressive osseous lesion. IMPRESSION: 1. 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 Difficult to determine if lesion compresses or arises from the bladder or both. Differential would include primary bladder wall neoplasm, atypical urachal carcinoma, GI stromal tumor, sarcoma or lymphoma. 2. Ventral abdominal hernia contains nonobstructed loops small bowel. 3. Prostate hypertrophy. Electronically Signed   By: Suzy Bouchard M.D.   On: 12/26/2017 10:26    Orson Eva, DO  [facility] Pager [phone]  If 7PM-7AM, please contact night-coverage [email or web address] Password [email or web address] 01/14/2018, 10:38 AM   LOS: 2 days

## 2018-01-14 NOTE — Progress Notes (Signed)
Patient reports four episodes loose stools, unseen by RN, MD made aware.

## 2018-01-15 ENCOUNTER — Other Ambulatory Visit: Payer: Self-pay | Admitting: Internal Medicine

## 2018-01-15 LAB — BASIC METABOLIC PANEL
Anion gap: 8 (ref 5–15)
BUN: 19 mg/dL (ref 6–20)
CALCIUM: 9 mg/dL (ref 8.9–10.3)
CO2: 26 mmol/L (ref 22–32)
Chloride: 105 mmol/L (ref 101–111)
Creatinine, Ser: 1.44 mg/dL — ABNORMAL HIGH (ref 0.61–1.24)
GFR calc Af Amer: 54 mL/min — ABNORMAL LOW (ref >60)
GFR calc non Af Amer: 47 mL/min — ABNORMAL LOW (ref >60)
Glucose, Bld: 125 mg/dL — ABNORMAL HIGH (ref 65–99)
Potassium: 3.7 mmol/L (ref 3.5–5.1)
SODIUM: 139 mmol/L (ref 135–145)

## 2018-01-15 LAB — CBC
HCT: 30.6 % — ABNORMAL LOW (ref 39.0–52.0)
Hemoglobin: 9.3 g/dL — ABNORMAL LOW (ref 13.0–17.0)
MCH: 23.3 pg — AB (ref 26.0–34.0)
MCHC: 30.4 g/dL (ref 30.0–36.0)
MCV: 76.5 fL — ABNORMAL LOW (ref 78.0–100.0)
Platelets: 279 10*3/uL (ref 150–400)
RBC: 4 MIL/uL — ABNORMAL LOW (ref 4.22–5.81)
RDW: 15.1 % (ref 11.5–15.5)
WBC: 8.1 10*3/uL (ref 4.0–10.5)

## 2018-01-15 LAB — MAGNESIUM: MAGNESIUM: 1.9 mg/dL (ref 1.7–2.4)

## 2018-01-15 MED ORDER — DILTIAZEM HCL ER COATED BEADS 360 MG PO CP24: 360.0000 mg | ORAL_CAPSULE | Freq: Every day | ORAL | 1 refills | Status: DC

## 2018-01-15 MED ORDER — METOPROLOL TARTRATE 50 MG PO TABS: 50.0000 mg | ORAL_TABLET | Freq: Two times a day (BID) | ORAL | 1 refills | Status: DC

## 2018-01-15 NOTE — Discharge Instructions (Signed)
1. Follow up with PCP in 1-2 weeks °2. Please obtain BMP/CBC in one week ° ° ° °

## 2018-01-15 NOTE — Progress Notes (Signed)
Progress Note  Patient Name: Benjamin Chaney Date of Encounter: 01/15/2018  Primary Cardiologist: Kate Sable, MD   Subjective   No SOB  No palpitaitons    Inpatient Medications    Scheduled Meds: . aspirin  325 mg Oral Daily  . diltiazem  360 mg Oral Daily  . dutasteride  0.5 mg Oral Daily  . metoprolol tartrate  50 mg Oral BID  . sodium chloride flush  3 mL Intravenous Q12H   Continuous Infusions: . sodium chloride     PRN Meds: sodium chloride, acetaminophen, ondansetron (ZOFRAN) IV, sodium chloride flush   Vital Signs    Vitals:   01/14/18 1400 01/14/18 2105 01/15/18 0507 01/15/18 0939  BP: (!) 110/55 (!) 95/53 118/65 130/79  Pulse:  67 71 93  Resp:  16 16   Temp:  99.5 F (37.5 C) 99.3 F (37.4 C)   TempSrc:  Oral Oral   SpO2:  100% 100%   Weight:   149 lb 11.2 oz (67.9 kg)   Height:        Intake/Output Summary (Last 24 hours) at 01/15/2018 1117 Last data filed at 01/15/2018 0548 Gross per 24 hour  Intake 480 ml  Output 1300 ml  Net -820 ml   Filed Weights   01/13/18 0455 01/14/18 0503 01/15/18 0507  Weight: 156 lb 1.4 oz (70.8 kg) 150 lb 8 oz (68.3 kg) 149 lb 11.2 oz (67.9 kg)    Telemetry    Atrial fib   AVerage HR appears 80s   Personally Reviewed  ECG      Physical Exam   GEN: No acute distress.   Neck: No JVD Cardiac:  Irreg irreg   no murmurs, rubs, or gallops.  Respiratory: Clear to auscultation bilaterally. GI: Soft, nontender, non-distended  MS: No edema; No deformity. Neuro:  Nonfocal  Psych: Normal affect   Labs    Chemistry Recent Labs  Lab 01/11/18 1056  01/13/18 0630 01/14/18 0615 01/15/18 0718  NA 140   < > 139 140 139  K 4.0   < > 3.8 4.1 3.7  CL 106   < > 106 108 105  CO2 26   < > 25 25 26   GLUCOSE 114*   < > 100* 104* 125*  BUN 20   < > 17 20 19   CREATININE 1.35*   < > 1.33* 1.39* 1.44*  CALCIUM 9.2   < > 8.7* 9.0 9.0  PROT 5.9*  --   --   --   --   ALBUMIN 3.0*  --   --   --   --   AST 14*  --    --   --   --   ALT 12*  --   --   --   --   ALKPHOS 71  --   --   --   --   BILITOT 0.6  --   --   --   --   GFRNONAA 50*   < > 51* 49* 47*  GFRAA 59*   < > 60* 56* 54*  ANIONGAP 8   < > 8 7 8    < > = values in this interval not displayed.     Hematology Recent Labs  Lab 01/12/18 0447 01/13/18 0630 01/15/18 0718  WBC 7.4 7.5 8.1  RBC 3.52* 3.72* 4.00*  HGB 8.3* 8.6* 9.3*  HCT 26.8* 28.2* 30.6*  MCV 76.1* 75.8* 76.5*  MCH 23.6* 23.1* 23.3*  MCHC 31.0  30.5 30.4  RDW 15.0 14.9 15.1  PLT 253 273 279    Cardiac Enzymes Recent Labs  Lab 01/11/18 1056 01/11/18 1648 01/11/18 2211 01/12/18 0447  TROPONINI <0.03 <0.03 <0.03 <0.03   No results for input(s): TROPIPOC in the last 168 hours.   BNPNo results for input(s): BNP, PROBNP in the last 168 hours.   DDimer No results for input(s): DDIMER in the last 168 hours.   Radiology    No results found.  Cardiac Studies  Echo:  ------------------------------------------------------------------- Study Conclusions  - Left ventricle: The cavity size was normal. Wall thickness was   increased in a pattern of moderate LVH. Systolic function was   normal. The estimated ejection fraction was in the range of 55%   to 60%. Wall motion was normal; there were no regional wall   motion abnormalities. The study is not technically sufficient to   allow evaluation of LV diastolic function. - Aortic valve: Trileaflet; mildly thickened leaflets. - Mitral valve: There was mild regurgitation. - Left atrium: The atrium was severely dilated. - Right atrium: The atrium was moderately dilated. - Atrial septum: No defect or patent foramen ovale was identified. - Tricuspid valve: Peak RV-RA gradient (S): 23 mm Hg. - Pulmonary arteries: Systolic pressure could not be accurately   estimated. - Inferior vena cava: Not well visualized. - Pericardium, extracardiac: A small pericardial effusion was   identified posterior to the  heart.   Patient Profile     72 y.o. male with chronic atrial fib    Assessment & Plan    1  Chronic atrial fibrillation   HR was fast on admit  Better now but he has not done any walking   I have asked him to walk  If HR not too high or he is not dizzy I feel OK for d/c   .   CHADS VASC 2 (HTN, age)  Declined anticoagulation  Plus he is anemic and has bladder tumor and hematuria  No anticoag   May need to d/c ASA      2  HTN  BP fairly well controlled with med changes    3  CKD  Cr rel stable    4  Anemia WIll need tobe followed    5  Bladder tumor   Diagnosed a couple wkts agod  For questions or updates, please contact [facility] Please consult [email or web address] for contact info under Cardiology/STEMI.      Signed, Dorris Carnes, MD  01/15/2018, 11:17 AM

## 2018-01-15 NOTE — Discharge Summary (Signed)
Physician Discharge Summary  Benjamin Chaney DZH:[id number] DOB: [birth year] DOA: 01/11/2018  PCP: Caren Macadam, MD  Admit date: 01/11/2018 Discharge date: 01/15/2018  Admitted From:  Disposition:  Home   Recommendations for Outpatient Follow-up:  1. Follow up with PCP in 1-2 weeks 2. Please obtain BMP/CBC in one week  Discharge Condition: Stable CODE STATUS: FULL Diet recommendation: Heart Healthy   Brief/Interim Summary: 72 year old male with a history of CKD stage III, hypertension, hyperlipidemia, OSA, and atrial fibrillation who presented from the cardiology office when he was noted to have atrial fibrillation with RVR. 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 He had a CT of the abdomen and pelvis on 12/26/2017 which demonstrated a large mass contiguous with the dome of the bladder with the differential diagnosis including neoplasm.In the emergency department, the patient was noted to have a heart rate in the 140s. He was started on a diltiazem drip which has been weaned off. He was switched to long-acting diltiazem which was titrated up to 368milligrams daily. The patient was seen by cardiology who recommended echocardiogramand titration of meds to improve rate control. He continued to have HRs up to 120s with minimal exertion.  The patient's diltiazem CD was titrated up to 360 mg daily.  Metoprolol tartrate was added and titrated to 50 mg twice daily.  Fortunately, the patient's heart rate came under better control.  On the morning of 01/15/2018, the patient ambulated with his heart rate reaching 110, but mostly staying 100-110 during ambulation.  Cardiology followed the patient and felt the patient was stable for discharge.    Discharge Diagnoses:  Atrial fibrillation with RVR -Initially started on diltiazem drip which has been weaned off- -diltiazemCDtitrate up to 360 mg daily -Discontinue  benazepril to allow room to titrate up diltiazem  -Increasedmetoprolol 50mg  twice daily as BP allows -Echo--EF 55-60%, no WMA, mild MR, small pericardial effusion -TSH 1.330 -3/18--patient ambulated with heart rate maintaining low 100s, max 110.  Cardiology cleared pt for d/c -CHADS-VASc 2 - pt refuses anticoagulation, but poor candidate presently due to hematuria  CKD stage III -Baseline creatinine 1.3-1.6 -Monitor with serial BMP -serum creatinine 1.44 on day of discharge  Hematuria/bladder tumor -Patient canceled his appointment [facility] for cystoscopy on 01/18/18 -he wants to switch care to [facility] -Monitor hemoglobin--am CBC -Baseline hemoglobin~9 -Hgb 9.3 on day of d/c  Essential hypertension -Continue diltiazem and metoprolol -Holding Cardura to allow room for titration of diltiazem -Discontinue benazepril  Colon cancer -Status post left hemicolectomy     Discharge Instructions   Allergies as of 01/15/2018   No Known Allergies     Medication List    STOP taking these medications   benazepril 40 MG tablet Commonly known as:  LOTENSIN   doxazosin 4 MG tablet Commonly known as:  CARDURA     TAKE these medications   aspirin 325 MG tablet Take 325 mg by mouth daily.   dicyclomine 10 MG capsule Commonly known as:  BENTYL TAKE ONE TO TWO CAPSULES BY MOUTH BEFORE BREAKFAST AND  BEFORE  LUNCH  FOR  DIARRHEA  AS  NEEDED   diltiazem 360 MG 24 hr capsule Commonly known as:  CARDIZEM CD Take 1 capsule (360 mg total) by mouth daily. Start taking on:  01/16/2018 What changed:    medication strength  how much to take   dutasteride 0.5 MG capsule Commonly known as:  AVODART Take 0.5 mg by mouth  daily.   furosemide 40 MG tablet Commonly known as:  LASIX Take 40 mg by mouth daily.   metoprolol tartrate 50 MG tablet Commonly known as:  LOPRESSOR Take 1 tablet (50 mg total) by mouth 2 (two) times daily.       No Known  Allergies  Consultations:  cardiology   Procedures/Studies: Dg Chest 2 View  Result Date: 01/11/2018 CLINICAL DATA:  Weakness EXAM: CHEST - 2 VIEW COMPARISON:  None. FINDINGS: Heart size upper normal. Negative for heart failure. Lungs are clear without infiltrate effusion or mass. No acute skeletal abnormality. IMPRESSION: No active cardiopulmonary disease. Electronically Signed   By: Franchot Gallo M.D.   On: 01/11/2018 11:27   Ct Abdomen Pelvis W Contrast  Result Date: 12/26/2017 CLINICAL DATA:  LOSS OF APPETITE WITH DIARRHEA X 1 MONTH. HX COLON POLYPS REMOVED. ^65mL ISOVUE-300 IOPAMIDOL (ISOVUE-300) INJECTION 61% EXAM: CT ABDOMEN AND PELVIS WITH CONTRAST TECHNIQUE: Multidetector CT imaging of the abdomen and pelvis was performed using the standard protocol following bolus administration of intravenous contrast. CONTRAST:  101mL ISOVUE-300 IOPAMIDOL (ISOVUE-300) INJECTION 61% COMPARISON:  None. FINDINGS: Lower chest: Lung bases are clear. Hepatobiliary: Small hepatic cysts.  Gallbladder Pancreas: Pancreatic duct is prominent but felt normal limits. No mass or. Spleen: Normal spleen Adrenals/urinary tract: Adrenal glands are normal. Small bilateral renal cysts noted. Ureters grossly There is a large mass contiguous with the dome of the bladder extending out of pelvis the level of the umbilicus. Mass measures 11.5 by 12.7 12.0 cm (volume = 918 cm^3). Mass compresses the bladder a portion of compressed bladder extending into the RIGHT iliac fossa (image 50, series 2) Mass relatively homogeneous with internal enhancement and vascularity. On sagittal projection difficulty cell in mass arises from the dome of the bladder wall or compresses the dome. Bladder wall enhancing adjacent mass Stomach/Bowel: Stomach duodenum small-bowel normal. No obstruction. Loop of small bowel does enter ventral hernia without evidence obstruction. Hernia superior to the umbilicus (image 30, series Vascular/Lymphatic: No  evidence of pelvic lymphadenopathy. No periaortic adenopathy calcification Reproductive: Prostate gland is enlarged the base bladder measuring 5.7 cm in diameter Other: No free fluid. Musculoskeletal: No aggressive osseous lesion. IMPRESSION: 1. 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 Difficult to determine if lesion compresses or arises from the bladder or both. Differential would include primary bladder wall neoplasm, atypical urachal carcinoma, GI stromal tumor, sarcoma or lymphoma. 2. Ventral abdominal hernia contains nonobstructed loops small bowel. 3. Prostate hypertrophy. Electronically Signed   By: Suzy Bouchard M.D.   On: 12/26/2017 10:26        Discharge Exam: Vitals:   01/15/18 0507 01/15/18 0939  BP: 118/65 130/79  Pulse: 71 93  Resp: 16   Temp: 99.3 F (37.4 C)   SpO2: 100%    Vitals:   01/14/18 1400 01/14/18 2105 01/15/18 0507 01/15/18 0939  BP: (!) 110/55 (!) 95/53 118/65 130/79  Pulse:  67 71 93  Resp:  16 16   Temp:  99.5 F (37.5 C) 99.3 F (37.4 C)   TempSrc:  Oral Oral   SpO2:  100% 100%   Weight:   67.9 kg (149 lb 11.2 oz)   Height:        General: Pt is alert, awake, not in acute distress Cardiovascular: IRRR, S1/S2 +, no rubs, no gallops Respiratory: CTA bilaterally, no wheezing, no rhonchi Abdominal: Soft, NT, ND, bowel sounds + Extremities: no edema, no  cyanosis   The results of significant diagnostics from this hospitalization (including imaging, microbiology, ancillary and laboratory) are listed below for reference.    Significant Diagnostic Studies: Dg Chest 2 View  Result Date: 01/11/2018 CLINICAL DATA:  Weakness EXAM: CHEST - 2 VIEW COMPARISON:  None. FINDINGS: Heart size upper normal. Negative for heart failure. Lungs are clear without infiltrate effusion or mass. No acute skeletal abnormality. IMPRESSION: No active cardiopulmonary disease. Electronically Signed   By: Franchot Gallo M.D.   On: 01/11/2018 11:27   Ct Abdomen Pelvis W Contrast  Result Date: 12/26/2017 CLINICAL DATA:  LOSS OF APPETITE WITH DIARRHEA X 1 MONTH. HX COLON POLYPS REMOVED. ^12mL ISOVUE-300 IOPAMIDOL (ISOVUE-300) INJECTION 61% EXAM: CT ABDOMEN AND PELVIS WITH CONTRAST TECHNIQUE: Multidetector CT imaging of the abdomen and pelvis was performed using the standard protocol following bolus administration of intravenous contrast. CONTRAST:  66mL ISOVUE-300 IOPAMIDOL (ISOVUE-300) INJECTION 61% COMPARISON:  None. FINDINGS: Lower chest: Lung bases are clear. Hepatobiliary: Small hepatic cysts.  Gallbladder Pancreas: Pancreatic duct is prominent but felt normal limits. No mass or. Spleen: Normal spleen Adrenals/urinary tract: Adrenal glands are normal. Small bilateral renal cysts noted. Ureters grossly There is a large mass contiguous with the dome of the bladder extending out of pelvis the level of the umbilicus. Mass measures 11.5 by 12.7 12.0 cm (volume = 918 cm^3). Mass compresses the bladder a portion of compressed bladder extending into the RIGHT iliac fossa (image 50, series 2) Mass relatively homogeneous with internal enhancement and vascularity. On sagittal projection difficulty cell in mass arises from the dome of the bladder wall or compresses the dome. Bladder wall enhancing adjacent mass Stomach/Bowel: Stomach duodenum small-bowel normal. No obstruction. Loop of small bowel does enter ventral hernia without evidence obstruction. Hernia superior to the umbilicus (image 30, series Vascular/Lymphatic: No evidence of pelvic lymphadenopathy. No periaortic adenopathy calcification Reproductive: Prostate gland is enlarged the base bladder measuring 5.7 cm in diameter Other: No free fluid. Musculoskeletal: No aggressive osseous lesion. IMPRESSION: 1. 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 Difficult to determine if lesion compresses or arises from the  bladder or both. Differential would include primary bladder wall neoplasm, atypical urachal carcinoma, GI stromal tumor, sarcoma or lymphoma. 2. Ventral abdominal hernia contains nonobstructed loops small bowel. 3. Prostate hypertrophy. Electronically Signed   By: Suzy Bouchard M.D.   On: 12/26/2017 10:26     Microbiology: No results found for this or any previous visit (from the past 240 hour(s)).   Labs: Basic Metabolic Panel: Recent Labs  Lab 01/11/18 1056 01/12/18 0447 01/12/18 0824 01/13/18 0630 01/14/18 0615 01/15/18 0718  NA 140 142  --  139 140 139  K 4.0 4.4  --  3.8 4.1 3.7  CL 106 109  --  106 108 105  CO2 26 25  --  25 25 26   GLUCOSE 114* 94  --  100* 104* 125*  BUN 20 20  --  17 20 19   CREATININE 1.35* 1.28*  --  1.33* 1.39* 1.44*  CALCIUM 9.2 8.7*  --  8.7* 9.0 9.0  MG  --   --  1.7 2.1  --  1.9   Liver Function Tests: Recent Labs  Lab 01/11/18 1056  AST 14*  ALT 12*  ALKPHOS 71  BILITOT 0.6  PROT 5.9*  ALBUMIN 3.0*   No results for input(s): LIPASE, AMYLASE in the last 168 hours. No results for input(s):  AMMONIA in the last 168 hours. CBC: Recent Labs  Lab 01/11/18 1056 01/12/18 0447 01/13/18 0630 01/15/18 0718  WBC 8.0 7.4 7.5 8.1  NEUTROABS 5.6  --   --   --   HGB 8.8* 8.3* 8.6* 9.3*  HCT 28.1* 26.8* 28.2* 30.6*  MCV 75.9* 76.1* 75.8* 76.5*  PLT 249 253 273 279   Cardiac Enzymes: Recent Labs  Lab 01/11/18 1056 01/11/18 1648 01/11/18 2211 01/12/18 0447  TROPONINI <0.03 <0.03 <0.03 <0.03   BNP: Invalid input(s): POCBNP CBG: No results for input(s): GLUCAP in the last 168 hours.  Time coordinating discharge:  Greater than 30 minutes  Signed:  Orson Eva, DO [facility] Pager: [phone] 01/15/2018, 12:20 PM

## 2018-01-15 NOTE — Progress Notes (Signed)
Patient had two pauses of 2.05 and 2.04 seconds. Hospitalist, Dr. Carles Collet was paged and he told me to make sure the oncall cardiologist was aware. I paged Dr. Harrington Challenger and she said it was fine to still discharge him. I went over the discharge instructions with the patient and his wife, they verbalized understanding. IV was removed, patient tolerated procedure well. Patient understands to follow up with his PCP and with [facility].

## 2018-01-31 ENCOUNTER — Encounter: Payer: Self-pay | Admitting: Student

## 2018-01-31 ENCOUNTER — Ambulatory Visit (INDEPENDENT_AMBULATORY_CARE_PROVIDER_SITE_OTHER): Payer: Managed Care, Other (non HMO) | Admitting: Student

## 2018-01-31 VITALS — BP 100/60 | HR 75 | Ht 69.0 in | Wt 156.2 lb

## 2018-01-31 DIAGNOSIS — I480 Paroxysmal atrial fibrillation: Secondary | ICD-10-CM

## 2018-01-31 DIAGNOSIS — N3289 Other specified disorders of bladder: Secondary | ICD-10-CM | POA: Diagnosis not present

## 2018-01-31 DIAGNOSIS — I10 Essential (primary) hypertension: Secondary | ICD-10-CM

## 2018-01-31 MED ORDER — DILTIAZEM HCL ER COATED BEADS 360 MG PO CP24: 360.0000 mg | ORAL_CAPSULE | Freq: Every day | ORAL | 3 refills | Status: AC

## 2018-01-31 MED ORDER — METOPROLOL TARTRATE 25 MG PO TABS: 25.0000 mg | ORAL_TABLET | Freq: Two times a day (BID) | ORAL | 3 refills | Status: AC

## 2018-01-31 NOTE — Patient Instructions (Signed)
Medication Instructions:  Your physician has recommended you make the following change in your medication:  Decrease Lopressor to 25 mg Two Times Daily    Labwork: NONE   Testing/Procedures: NONE   Follow-Up: Your physician recommends that you schedule a follow-up appointment in: 3-4 Months with Dr. Dwana Curd    Any Other Special Instructions Will Be Listed Below (If Applicable).  You have been referred to Urology     If you need a refill on your cardiac medications before your next appointment, please call your pharmacy.  Thank you for choosing [facility]!

## 2018-01-31 NOTE — Progress Notes (Signed)
Cardiology Office Note    Date:  01/31/2018   ID:  Benjamin, Chaney 1946-03-04, MRN [id number]  PCP:  Benjamin Macadam, MD  Cardiologist: Benjamin Sable, MD    Chief Complaint  Patient presents with  . Hospitalization Follow-up    History of Present Illness:    Benjamin Chaney is a 72 y.o. male with past medical history of paroxysmal atrial fibrillation (noted on prior EKG's from 2015), HTN, OSA, Stage 3 CKD and recently diagnosed bladder tumor (concern for malignancy - awaiting further evaluation) who presents to the office today for hospital follow-up.  He was evaluated in clinic by Dr. Bronson Chaney on 01/12/2018 and was found to be in atrial fibrillation with RVR with heart rate in the 130s-140s. He was asymptomatic with the arrhythmia but due to his elevated rates and borderline BP, he was admitted to the hospital for further management. Initial labs showed WBC 8.0, Hgb 8.8, platelets 249, Na+ 140, K+ 4.0, and creatinine 1.35. TSH at 1.330. Mg 1.7. Initial and cyclic troponin values were negative. Echo showed a preserved EF of 55-60% with no regional WMA, mild MR, and biatrial dilation. He was started on PO Cardizem CD and this was titrated throughout admission. Lopressor was also added for further rate-control and Benazepril was discontinued. He was not placed on anticoagulation due to his anemia, active hematuria, and concern for bladder neoplasm.  In talking with the patient today, he reports experiencing significant fatigue since hospital discharge. Says he has little energy during the day. He denies any recent chest pain, palpitations, dyspnea on exertion, orthopnea, PND, or lower extremity edema.  No known recurrence of hematuria since hospital discharge. Reports he did have to cancel his appointment with [facility] due to being in the hospital at that time. Request a referral back to Urology for further evaluation of his bladder mass.    Past Medical History:  Diagnosis  Date  . Atrial fibrillation ([place])    a. noted on EKG's in 2015 b. 12/2017: admitted with atrial fibrillation with RVR  . Cataract   . Chronic kidney disease, stage III (moderate) ([place]) 12/26/2012  . Colon cancer ([place]) 1997   left hemicolectomy   . Heart murmur   . Hypercholesterolemia   . Hypertension   . Microcytic anemia 12/19/2017  . Obstructive sleep apnea 09/14/2014    Past Surgical History:  Procedure Laterality Date  . BACK SURGERY    . BACTERIAL OVERGROWTH TEST N/A 10/01/2014   POSITIVE FOR SIBO  . COLON SURGERY    . COLONOSCOPY  2004   Dr. Lindalou Hose: normal  . COLONOSCOPY N/A 03/11/2014   NL ILEUM, & COLON Bx, POLYPOID LESIONS, SML IH, NL ANASTOMOSIS  . HERNIA REPAIR     in his 45s  . SPINE SURGERY      Current Medications: Outpatient Medications Prior to Visit  Medication Sig Dispense Refill  . aspirin 325 MG tablet Take 325 mg by mouth daily.    Marland Kitchen dicyclomine (BENTYL) 10 MG capsule TAKE ONE TO TWO CAPSULES BY MOUTH BEFORE BREAKFAST AND  BEFORE  LUNCH  FOR  DIARRHEA  AS  NEEDED 90 capsule 5  . dutasteride (AVODART) 0.5 MG capsule Take 0.5 mg by mouth daily.    . furosemide (LASIX) 40 MG tablet Take 40 mg by mouth daily.     Marland Kitchen diltiazem (CARDIZEM CD) 360 MG 24 hr capsule Take 1 capsule (360 mg total) by mouth daily. 30 capsule 1  . metoprolol tartrate (LOPRESSOR) 50  MG tablet Take 1 tablet (50 mg total) by mouth 2 (two) times daily. 60 tablet 1   No facility-administered medications prior to visit.      Allergies:   Patient has no known allergies.   Social History   Socioeconomic History  . Marital status: Married    Spouse name: Benjamin Chaney  . Number of children: 5  . Years of education: Not on file  . Highest education level: Associate degree: academic program  Occupational History  . Occupation: Best boy  Social Needs  . Financial resource strain: Not on file  . Food insecurity:    Worry: Not on file    Inability: Not on file  .  Transportation needs:    Medical: Not on file    Non-medical: Not on file  Tobacco Use  . Smoking status: Never Smoker  . Smokeless tobacco: Never Used  . Tobacco comment: Never smoked  Substance and Sexual Activity  . Alcohol use: No  . Drug use: No  . Sexual activity: Not Currently  Lifestyle  . Physical activity:    Days per week: Not on file    Minutes per session: Not on file  . Stress: Not on file  Relationships  . Social connections:    Talks on phone: Not on file    Gets together: Not on file    Attends religious service: Not on file    Active member of club or organization: Not on file    Attends meetings of clubs or organizations: Not on file    Relationship status: Not on file  Other Topics Concern  . Not on file  Social History Narrative   Lives with wife Benjamin Chaney   Has many children and grands and great grands   Ex wife "still in the neighborhood", with oldest son [name]     Family History:  The patient's family history includes Alcohol abuse in his father and mother; COPD in his mother; Diabetes in his brother and brother; Heart disease in his mother; Hypertension in his brother; Kidney disease in his brother.   Review of Systems:   Please see the history of present illness.     General:  No chills, fever, night sweats or weight changes. Positive for fatigue.  Cardiovascular:  No chest pain, dyspnea on exertion, edema, orthopnea, palpitations, paroxysmal nocturnal dyspnea. Dermatological: No rash, lesions/masses Respiratory: No cough, dyspnea Urologic: No hematuria, dysuria Abdominal:   No nausea, vomiting, diarrhea, bright red blood per rectum, melena, or hematemesis Neurologic:  No visual changes, wkns, changes in mental status.  All other systems reviewed and are otherwise negative except as noted above.   Physical Exam:    VS:  BP 100/60   Pulse 75   Ht 5\' 9"  (1.753 m)   Wt 156 lb 3.2 oz (70.9 kg)   SpO2 95%   BMI 23.07 kg/m    General: Well  developed, well nourished [place] American male appearing in no acute distress. Head: Normocephalic, atraumatic, sclera non-icteric, no xanthomas, nares are without discharge.  Neck: No carotid bruits. JVD not elevated.  Lungs: Respirations regular and unlabored, without wheezes or rales.  Heart: Irregularly irregular. No S3 or S4.  No murmur, no rubs, or gallops appreciated. Abdomen: Soft, non-tender, non-distended with normoactive bowel sounds. No hepatomegaly. No rebound/guarding. No obvious abdominal masses. Msk:  Strength and tone appear normal for age. No joint deformities or effusions. Extremities: No clubbing or cyanosis. No lower extremity edema.  Distal pedal pulses are 2+ bilaterally.  Neuro: Alert and oriented X 3. Moves all extremities spontaneously. No focal deficits noted. Psych:  Responds to questions appropriately with a normal affect. Skin: No rashes or lesions noted  Wt Readings from Last 3 Encounters:  01/31/18 156 lb 3.2 oz (70.9 kg)  01/15/18 149 lb 11.2 oz (67.9 kg)  01/11/18 162 lb (73.5 kg)     Studies/Labs Reviewed:   EKG:  EKG is not ordered today.  Recent Labs: 01/11/2018: ALT 12; TSH 1.330 01/15/2018: BUN 19; Creatinine, Ser 1.44; Hemoglobin 9.3; Magnesium 1.9; Platelets 279; Potassium 3.7; Sodium 139   Lipid Panel    Component Value Date/Time   CHOL 149 10/12/2017 1546   TRIG 55 10/12/2017 1546   HDL 66 10/12/2017 1546   CHOLHDL 2.3 10/12/2017 1546   LDLCALC 69 10/12/2017 1546    Additional studies/ records that were reviewed today include:   Echocardiogram: 01/12/2018 Study Conclusions  - Left ventricle: The cavity size was normal. Wall thickness was   increased in a pattern of moderate LVH. Systolic function was   normal. The estimated ejection fraction was in the range of 55%   to 60%. Wall motion was normal; there were no regional wall   motion abnormalities. The study is not technically sufficient to   allow evaluation of LV diastolic  function. - Aortic valve: Trileaflet; mildly thickened leaflets. - Mitral valve: There was mild regurgitation. - Left atrium: The atrium was severely dilated. - Right atrium: The atrium was moderately dilated. - Atrial septum: No defect or patent foramen ovale was identified. - Tricuspid valve: Peak RV-RA gradient (S): 23 mm Hg. - Pulmonary arteries: Systolic pressure could not be accurately   estimated. - Inferior vena cava: Not well visualized. - Pericardium, extracardiac: A small pericardial effusion was   identified posterior to the heart.  Assessment:    1. PAF (paroxysmal atrial fibrillation) ([place])   2. Essential hypertension   3. Bladder mass      Plan:   In order of problems listed above:  1. Paroxysmal Atrial Fibrillation -The patient has a history of atrial fibrillation dating back to 2015 and was recently admitted to the hospital for further management of atrial fibrillation with RVR. Labs showed that electrolytes were within normal limits and TSH was at 1.330. Echo showed a preserved EF of 55-60% with no regional WMA, mild MR, and biatrial dilation. - He notes significant fatigue since hospital discharge. Heart rate is well controlled in the 70's during today's visit but BP is soft. I reviewed with him that his fatigue is likely secondary to his heart rate now being well controlled when this was previously in the 130's-140's and due to his lower blood pressure. We will continue Cardizem CD 360 mg daily and reduce Lopressor from 50 mg BID to 25mg  BID.  - remains on ASA 325mg  daily. He has declined anticoagulation and is not a candidate for this currently due to his bladder tumor, anemia, and recent hematuria.    2. HTN - BP is soft at 100/60 during today's visit. Will reduce Lopressor dosing as outlined above.  3. Bladder Mass - Also noted to have hematuria during his recent admission. He had to cancel his appointment with [facility] due to his hospitalization.   Will re-enter the referral to Urology today.   Medication Adjustments/Labs and Tests Ordered: Current medicines are reviewed at length with the patient today.  Concerns regarding medicines are outlined above.  Medication changes, Labs and Tests ordered today are listed in the  Patient Instructions below. Patient Instructions  Medication Instructions:  Your physician has recommended you make the following change in your medication:  Decrease Lopressor to 25 mg Two Times Daily   Labwork: NONE   Testing/Procedures: NONE   Follow-Up: Your physician recommends that you schedule a follow-up appointment in: 3-4 Months with Dr. Dwana Curd   Any Other Special Instructions Will Be Listed Below (If Applicable).  You have been referred to Urology   If you need a refill on your cardiac medications before your next appointment, please call your pharmacy.  Thank you for choosing [facility]!    Signed, Erma Heritage, PA-C  01/31/2018 5:24 PM    [facility] [address] [place], [place] [address] Phone: [phone]

## 2018-02-05 ENCOUNTER — Other Ambulatory Visit: Payer: Self-pay | Admitting: Urology

## 2018-02-06 NOTE — Progress Notes (Signed)
02-01-18 Cardiac clearance on chart from [place], [facility]  01-12-18 (Epic) EKG  01-11-18 (Epic) CXR

## 2018-02-06 NOTE — Progress Notes (Signed)
01-12-18 (Epic) ECHO

## 2018-02-06 NOTE — Patient Instructions (Addendum)
Benjamin Chaney  02/06/2018   Your procedure is scheduled on: 02-09-18   Report to [facility] Main  Entrance Report to Admitting at 10:30 AM   Call this number if you have problems the morning of surgery [phone]   Remember: Do not eat food or drink liquids :After Midnight.     Take these medicines the morning of surgery with A SIP OF WATER: Diltiazem (Cardizem), and Metoprolol Tartrate (Lopressor)                               You may not have any metal on your body including hair pins and              piercings  Do not wear jewelry, lotions, powders or deodorant             Men may shave face and neck.   Do not bring valuables to the hospital. [place].  Contacts, dentures or bridgework may not be worn into surgery.    Special Instructions: Driver Benjamin Casanas [phone]   PLEASE BRING YOUR MASK AND TUBING FOR YOUR CPAP MACHINE     Please read over the following fact sheets you were given: _____________________________________________________________________         [facility] - Preparing for Surgery Before surgery, you can play an important role.  Because skin is not sterile, your skin needs to be as free of germs as possible.  You can reduce the number of germs on your skin by washing with CHG (chlorahexidine gluconate) soap before surgery.  CHG is an antiseptic cleaner which kills germs and bonds with the skin to continue killing germs even after washing. Please DO NOT use if you have an allergy to CHG or antibacterial soaps.  If your skin becomes reddened/irritated stop using the CHG and inform your nurse when you arrive at Short Stay. Do not shave (including legs and underarms) for at least 48 hours prior to the first CHG shower.  You may shave your face/neck. Please follow these instructions carefully:  1.  Shower with CHG Soap the night before surgery and the  morning of Surgery.  2.  If  you choose to wash your hair, wash your hair first as usual with your  normal  shampoo.  3.  After you shampoo, rinse your hair and body thoroughly to remove the  shampoo.                           4.  Use CHG as you would any other liquid soap.  You can apply chg directly  to the skin and wash                       Gently with a scrungie or clean washcloth.  5.  Apply the CHG Soap to your body ONLY FROM THE NECK DOWN.   Do not use on face/ open                           Wound or open sores. Avoid contact with eyes, ears mouth and genitals (private parts).  Wash face,  Genitals (private parts) with your normal soap.             6.  Wash thoroughly, paying special attention to the area where your surgery  will be performed.  7.  Thoroughly rinse your body with warm water from the neck down.  8.  DO NOT shower/wash with your normal soap after using and rinsing off  the CHG Soap.                9.  Pat yourself dry with a clean towel.            10.  Wear clean pajamas.            11.  Place clean sheets on your bed the night of your first shower and do not  sleep with pets. Day of Surgery : Do not apply any lotions/deodorants the morning of surgery.  Please wear clean clothes to the hospital/surgery center.  FAILURE TO FOLLOW THESE INSTRUCTIONS MAY RESULT IN THE CANCELLATION OF YOUR SURGERY PATIENT SIGNATURE_________________________________  NURSE SIGNATURE__________________________________  ________________________________________________________________________

## 2018-02-08 ENCOUNTER — Encounter (HOSPITAL_COMMUNITY)
Admission: RE | Admit: 2018-02-08 | Discharge: 2018-02-08 | Disposition: A | Discharge status: Home / Self Care | Payer: Managed Care, Other (non HMO) | Source: Ambulatory Visit | Attending: Urology | Admitting: Urology

## 2018-02-08 ENCOUNTER — Encounter (HOSPITAL_COMMUNITY): Payer: Self-pay

## 2018-02-08 ENCOUNTER — Other Ambulatory Visit: Payer: Self-pay

## 2018-02-08 DIAGNOSIS — R3915 Urgency of urination: Secondary | ICD-10-CM | POA: Diagnosis not present

## 2018-02-08 DIAGNOSIS — R011 Cardiac murmur, unspecified: Secondary | ICD-10-CM | POA: Diagnosis not present

## 2018-02-08 DIAGNOSIS — R350 Frequency of micturition: Secondary | ICD-10-CM | POA: Diagnosis not present

## 2018-02-08 DIAGNOSIS — E1122 Type 2 diabetes mellitus with diabetic chronic kidney disease: Secondary | ICD-10-CM | POA: Diagnosis not present

## 2018-02-08 DIAGNOSIS — Z6822 Body mass index (BMI) 22.0-22.9, adult: Secondary | ICD-10-CM | POA: Diagnosis not present

## 2018-02-08 DIAGNOSIS — N183 Chronic kidney disease, stage 3 (moderate): Secondary | ICD-10-CM | POA: Diagnosis not present

## 2018-02-08 DIAGNOSIS — R3911 Hesitancy of micturition: Secondary | ICD-10-CM | POA: Diagnosis not present

## 2018-02-08 DIAGNOSIS — D494 Neoplasm of unspecified behavior of bladder: Secondary | ICD-10-CM | POA: Diagnosis present

## 2018-02-08 DIAGNOSIS — Z7982 Long term (current) use of aspirin: Secondary | ICD-10-CM | POA: Diagnosis not present

## 2018-02-08 DIAGNOSIS — G4730 Sleep apnea, unspecified: Secondary | ICD-10-CM | POA: Diagnosis not present

## 2018-02-08 DIAGNOSIS — I129 Hypertensive chronic kidney disease with stage 1 through stage 4 chronic kidney disease, or unspecified chronic kidney disease: Secondary | ICD-10-CM | POA: Diagnosis not present

## 2018-02-08 DIAGNOSIS — Z9049 Acquired absence of other specified parts of digestive tract: Secondary | ICD-10-CM | POA: Diagnosis not present

## 2018-02-08 DIAGNOSIS — C673 Malignant neoplasm of anterior wall of bladder: Secondary | ICD-10-CM | POA: Diagnosis not present

## 2018-02-08 DIAGNOSIS — R39198 Other difficulties with micturition: Secondary | ICD-10-CM | POA: Diagnosis not present

## 2018-02-08 DIAGNOSIS — Z8249 Family history of ischemic heart disease and other diseases of the circulatory system: Secondary | ICD-10-CM | POA: Diagnosis not present

## 2018-02-08 DIAGNOSIS — I4891 Unspecified atrial fibrillation: Secondary | ICD-10-CM | POA: Diagnosis not present

## 2018-02-08 DIAGNOSIS — R351 Nocturia: Secondary | ICD-10-CM | POA: Diagnosis not present

## 2018-02-08 DIAGNOSIS — Z79899 Other long term (current) drug therapy: Secondary | ICD-10-CM | POA: Diagnosis not present

## 2018-02-08 DIAGNOSIS — Z85038 Personal history of other malignant neoplasm of large intestine: Secondary | ICD-10-CM | POA: Diagnosis not present

## 2018-02-08 DIAGNOSIS — D631 Anemia in chronic kidney disease: Secondary | ICD-10-CM | POA: Diagnosis not present

## 2018-02-08 DIAGNOSIS — Z86010 Personal history of colonic polyps: Secondary | ICD-10-CM | POA: Diagnosis not present

## 2018-02-08 DIAGNOSIS — N323 Diverticulum of bladder: Secondary | ICD-10-CM | POA: Diagnosis not present

## 2018-02-08 DIAGNOSIS — E7800 Pure hypercholesterolemia, unspecified: Secondary | ICD-10-CM | POA: Diagnosis not present

## 2018-02-08 DIAGNOSIS — Z825 Family history of asthma and other chronic lower respiratory diseases: Secondary | ICD-10-CM | POA: Diagnosis not present

## 2018-02-08 DIAGNOSIS — R634 Abnormal weight loss: Secondary | ICD-10-CM | POA: Diagnosis not present

## 2018-02-08 LAB — CBC
HCT: 30.3 % — ABNORMAL LOW (ref 39.0–52.0)
HEMOGLOBIN: 9.4 g/dL — AB (ref 13.0–17.0)
MCH: 23.4 pg — AB (ref 26.0–34.0)
MCHC: 31 g/dL (ref 30.0–36.0)
MCV: 75.4 fL — ABNORMAL LOW (ref 78.0–100.0)
PLATELETS: 345 10*3/uL (ref 150–400)
RBC: 4.02 MIL/uL — AB (ref 4.22–5.81)
RDW: 14.7 % (ref 11.5–15.5)
WBC: 9.4 10*3/uL (ref 4.0–10.5)

## 2018-02-08 NOTE — Progress Notes (Signed)
02-09-18 CBC result routed to Dr. Gloriann Loan for review.

## 2018-02-09 ENCOUNTER — Encounter (HOSPITAL_COMMUNITY)
Admission: RE | Disposition: A | Discharge status: Home / Self Care | Payer: Self-pay | Source: Ambulatory Visit | Attending: Urology

## 2018-02-09 ENCOUNTER — Ambulatory Visit (HOSPITAL_COMMUNITY): Payer: Managed Care, Other (non HMO)

## 2018-02-09 ENCOUNTER — Encounter (HOSPITAL_COMMUNITY): Payer: Self-pay | Admitting: *Deleted

## 2018-02-09 ENCOUNTER — Ambulatory Visit (HOSPITAL_COMMUNITY): Payer: Managed Care, Other (non HMO) | Admitting: Certified Registered Nurse Anesthetist

## 2018-02-09 ENCOUNTER — Other Ambulatory Visit: Payer: Self-pay

## 2018-02-09 ENCOUNTER — Ambulatory Visit (HOSPITAL_COMMUNITY)
Admission: RE | Admit: 2018-02-09 | Discharge: 2018-02-09 | Disposition: A | Discharge status: Home / Self Care | Payer: Managed Care, Other (non HMO) | Source: Ambulatory Visit | Attending: Urology | Admitting: Urology

## 2018-02-09 DIAGNOSIS — R39198 Other difficulties with micturition: Secondary | ICD-10-CM | POA: Insufficient documentation

## 2018-02-09 DIAGNOSIS — C673 Malignant neoplasm of anterior wall of bladder: Secondary | ICD-10-CM | POA: Insufficient documentation

## 2018-02-09 DIAGNOSIS — Z86010 Personal history of colonic polyps: Secondary | ICD-10-CM | POA: Insufficient documentation

## 2018-02-09 DIAGNOSIS — R634 Abnormal weight loss: Secondary | ICD-10-CM | POA: Insufficient documentation

## 2018-02-09 DIAGNOSIS — Z833 Family history of diabetes mellitus: Secondary | ICD-10-CM | POA: Insufficient documentation

## 2018-02-09 DIAGNOSIS — Z7982 Long term (current) use of aspirin: Secondary | ICD-10-CM | POA: Insufficient documentation

## 2018-02-09 DIAGNOSIS — Z6822 Body mass index (BMI) 22.0-22.9, adult: Secondary | ICD-10-CM | POA: Insufficient documentation

## 2018-02-09 DIAGNOSIS — R3911 Hesitancy of micturition: Secondary | ICD-10-CM | POA: Insufficient documentation

## 2018-02-09 DIAGNOSIS — I129 Hypertensive chronic kidney disease with stage 1 through stage 4 chronic kidney disease, or unspecified chronic kidney disease: Secondary | ICD-10-CM | POA: Insufficient documentation

## 2018-02-09 DIAGNOSIS — Z79899 Other long term (current) drug therapy: Secondary | ICD-10-CM | POA: Insufficient documentation

## 2018-02-09 DIAGNOSIS — D631 Anemia in chronic kidney disease: Secondary | ICD-10-CM | POA: Insufficient documentation

## 2018-02-09 DIAGNOSIS — Z8249 Family history of ischemic heart disease and other diseases of the circulatory system: Secondary | ICD-10-CM | POA: Insufficient documentation

## 2018-02-09 DIAGNOSIS — R350 Frequency of micturition: Secondary | ICD-10-CM | POA: Insufficient documentation

## 2018-02-09 DIAGNOSIS — Z811 Family history of alcohol abuse and dependence: Secondary | ICD-10-CM | POA: Insufficient documentation

## 2018-02-09 DIAGNOSIS — R3915 Urgency of urination: Secondary | ICD-10-CM | POA: Insufficient documentation

## 2018-02-09 DIAGNOSIS — N323 Diverticulum of bladder: Secondary | ICD-10-CM | POA: Insufficient documentation

## 2018-02-09 DIAGNOSIS — Z85038 Personal history of other malignant neoplasm of large intestine: Secondary | ICD-10-CM | POA: Insufficient documentation

## 2018-02-09 DIAGNOSIS — Z9049 Acquired absence of other specified parts of digestive tract: Secondary | ICD-10-CM | POA: Insufficient documentation

## 2018-02-09 DIAGNOSIS — Z841 Family history of disorders of kidney and ureter: Secondary | ICD-10-CM | POA: Insufficient documentation

## 2018-02-09 DIAGNOSIS — E1122 Type 2 diabetes mellitus with diabetic chronic kidney disease: Secondary | ICD-10-CM | POA: Insufficient documentation

## 2018-02-09 DIAGNOSIS — N183 Chronic kidney disease, stage 3 (moderate): Secondary | ICD-10-CM | POA: Insufficient documentation

## 2018-02-09 DIAGNOSIS — G4730 Sleep apnea, unspecified: Secondary | ICD-10-CM | POA: Insufficient documentation

## 2018-02-09 DIAGNOSIS — E7800 Pure hypercholesterolemia, unspecified: Secondary | ICD-10-CM | POA: Insufficient documentation

## 2018-02-09 DIAGNOSIS — I4891 Unspecified atrial fibrillation: Secondary | ICD-10-CM | POA: Insufficient documentation

## 2018-02-09 DIAGNOSIS — R011 Cardiac murmur, unspecified: Secondary | ICD-10-CM | POA: Insufficient documentation

## 2018-02-09 DIAGNOSIS — R351 Nocturia: Secondary | ICD-10-CM | POA: Insufficient documentation

## 2018-02-09 DIAGNOSIS — Z825 Family history of asthma and other chronic lower respiratory diseases: Secondary | ICD-10-CM | POA: Insufficient documentation

## 2018-02-09 HISTORY — PX: TRANSURETHRAL RESECTION OF BLADDER TUMOR: SHX2575

## 2018-02-09 HISTORY — PX: CYSTOGRAM: SHX6285

## 2018-02-09 LAB — CBC
HEMATOCRIT: 28.5 % — AB (ref 39.0–52.0)
HEMOGLOBIN: 9 g/dL — AB (ref 13.0–17.0)
MCH: 23.5 pg — ABNORMAL LOW (ref 26.0–34.0)
MCHC: 31.6 g/dL (ref 30.0–36.0)
MCV: 74.4 fL — AB (ref 78.0–100.0)
Platelets: 357 10*3/uL (ref 150–400)
RBC: 3.83 MIL/uL — AB (ref 4.22–5.81)
RDW: 14.7 % (ref 11.5–15.5)
WBC: 9.3 10*3/uL (ref 4.0–10.5)

## 2018-02-09 LAB — ABO/RH: ABO/RH(D): B POS

## 2018-02-09 LAB — TYPE AND SCREEN
ABO/RH(D): B POS
Antibody Screen: NEGATIVE

## 2018-02-09 SURGERY — TURBT (TRANSURETHRAL RESECTION OF BLADDER TUMOR)
Anesthesia: General

## 2018-02-09 MED ORDER — ONDANSETRON HCL 4 MG/2ML IJ SOLN: 4.0000 mg | Freq: Once | INTRAMUSCULAR | Status: DC | PRN

## 2018-02-09 MED ORDER — SUGAMMADEX SODIUM 200 MG/2ML IV SOLN
INTRAVENOUS | Status: DC | PRN
  Administered 2018-02-09: 200 mg via INTRAVENOUS

## 2018-02-09 MED ORDER — DEXTROSE 5 % IV SOLN
INTRAVENOUS | Status: DC | PRN
  Administered 2018-02-09: 80 ug/min via INTRAVENOUS

## 2018-02-09 MED ORDER — DEXAMETHASONE SODIUM PHOSPHATE 10 MG/ML IJ SOLN
INTRAMUSCULAR | Status: AC
  Filled 2018-02-09: qty 1

## 2018-02-09 MED ORDER — OXYCODONE HCL 5 MG PO TABS
5.0000 mg | ORAL_TABLET | Freq: Once | ORAL | Status: AC | PRN
  Administered 2018-02-09: 5 mg via ORAL

## 2018-02-09 MED ORDER — LIDOCAINE 2% (20 MG/ML) 5 ML SYRINGE
INTRAMUSCULAR | Status: AC
  Filled 2018-02-09: qty 5

## 2018-02-09 MED ORDER — FENTANYL CITRATE (PF) 100 MCG/2ML IJ SOLN
INTRAMUSCULAR | Status: AC
  Filled 2018-02-09: qty 2

## 2018-02-09 MED ORDER — OXYCODONE HCL 5 MG/5ML PO SOLN
5.0000 mg | Freq: Once | ORAL | Status: AC | PRN
  Filled 2018-02-09: qty 5

## 2018-02-09 MED ORDER — SCOPOLAMINE 1 MG/3DAYS TD PT72
MEDICATED_PATCH | TRANSDERMAL | Status: AC
  Filled 2018-02-09: qty 1

## 2018-02-09 MED ORDER — DIATRIZOATE MEGLUMINE 30 % UR SOLN
URETHRAL | Status: AC
  Filled 2018-02-09: qty 100

## 2018-02-09 MED ORDER — DIATRIZOATE MEGLUMINE 30 % UR SOLN
URETHRAL | Status: DC | PRN
  Administered 2018-02-09: 400 mL

## 2018-02-09 MED ORDER — OXYCODONE HCL 5 MG PO TABS
ORAL_TABLET | ORAL | Status: AC
  Filled 2018-02-09: qty 1

## 2018-02-09 MED ORDER — HYDROCODONE-ACETAMINOPHEN 5-325 MG PO TABS: 1.0000 | ORAL_TABLET | Freq: Four times a day (QID) | ORAL | 0 refills | Status: AC | PRN

## 2018-02-09 MED ORDER — PROPOFOL 10 MG/ML IV BOLUS
INTRAVENOUS | Status: DC | PRN
  Administered 2018-02-09: 110 mg via INTRAVENOUS

## 2018-02-09 MED ORDER — PHENYLEPHRINE 40 MCG/ML (10ML) SYRINGE FOR IV PUSH (FOR BLOOD PRESSURE SUPPORT)
PREFILLED_SYRINGE | INTRAVENOUS | Status: DC | PRN
  Administered 2018-02-09: 80 ug via INTRAVENOUS
  Administered 2018-02-09: 120 ug via INTRAVENOUS
  Administered 2018-02-09: 40 ug via INTRAVENOUS

## 2018-02-09 MED ORDER — DEXAMETHASONE SODIUM PHOSPHATE 10 MG/ML IJ SOLN
INTRAMUSCULAR | Status: DC | PRN
  Administered 2018-02-09: 10 mg via INTRAVENOUS

## 2018-02-09 MED ORDER — FENTANYL CITRATE (PF) 100 MCG/2ML IJ SOLN: 25.0000 ug | INTRAMUSCULAR | Status: DC | PRN

## 2018-02-09 MED ORDER — PHENYLEPHRINE 40 MCG/ML (10ML) SYRINGE FOR IV PUSH (FOR BLOOD PRESSURE SUPPORT)
PREFILLED_SYRINGE | INTRAVENOUS | Status: AC
Start: 2018-02-09 — End: 2018-02-09
  Filled 2018-02-09: qty 20

## 2018-02-09 MED ORDER — CEFAZOLIN SODIUM-DEXTROSE 2-4 GM/100ML-% IV SOLN
2.0000 g | INTRAVENOUS | Status: AC
  Administered 2018-02-09: 2 g via INTRAVENOUS
  Filled 2018-02-09: qty 100

## 2018-02-09 MED ORDER — FENTANYL CITRATE (PF) 100 MCG/2ML IJ SOLN
INTRAMUSCULAR | Status: DC | PRN
  Administered 2018-02-09 (×3): 50 ug via INTRAVENOUS

## 2018-02-09 MED ORDER — SODIUM CHLORIDE 0.9 % IR SOLN
Status: DC | PRN
  Administered 2018-02-09: 18000 mL

## 2018-02-09 MED ORDER — ROCURONIUM BROMIDE 10 MG/ML (PF) SYRINGE
PREFILLED_SYRINGE | INTRAVENOUS | Status: AC
  Filled 2018-02-09: qty 5

## 2018-02-09 MED ORDER — SUGAMMADEX SODIUM 200 MG/2ML IV SOLN
INTRAVENOUS | Status: AC
  Filled 2018-02-09: qty 2

## 2018-02-09 MED ORDER — SCOPOLAMINE 1 MG/3DAYS TD PT72
MEDICATED_PATCH | TRANSDERMAL | Status: DC | PRN
  Administered 2018-02-09: 1 via TRANSDERMAL

## 2018-02-09 MED ORDER — PROPOFOL 10 MG/ML IV BOLUS
INTRAVENOUS | Status: AC
  Filled 2018-02-09: qty 20

## 2018-02-09 MED ORDER — ROCURONIUM BROMIDE 50 MG/5ML IV SOSY
PREFILLED_SYRINGE | INTRAVENOUS | Status: DC | PRN
  Administered 2018-02-09: 10 mg via INTRAVENOUS
  Administered 2018-02-09: 50 mg via INTRAVENOUS
  Administered 2018-02-09: 10 mg via INTRAVENOUS

## 2018-02-09 MED ORDER — ONDANSETRON HCL 4 MG/2ML IJ SOLN
INTRAMUSCULAR | Status: AC
  Filled 2018-02-09: qty 2

## 2018-02-09 MED ORDER — LIDOCAINE 2% (20 MG/ML) 5 ML SYRINGE
INTRAMUSCULAR | Status: DC | PRN
  Administered 2018-02-09: 70 mg via INTRAVENOUS

## 2018-02-09 MED ORDER — LACTATED RINGERS IV SOLN
INTRAVENOUS | Status: DC
  Administered 2018-02-09: 11:00:00 via INTRAVENOUS

## 2018-02-09 MED ORDER — DIATRIZOATE MEGLUMINE 30 % UR SOLN
URETHRAL | Status: AC
  Filled 2018-02-09: qty 200

## 2018-02-09 MED ORDER — ONDANSETRON HCL 4 MG/2ML IJ SOLN
INTRAMUSCULAR | Status: DC | PRN
  Administered 2018-02-09: 4 mg via INTRAVENOUS

## 2018-02-09 SURGICAL SUPPLY — 14 items
BAG URINE DRAINAGE (UROLOGICAL SUPPLIES) IMPLANT
BAG URO CATCHER STRL LF (MISCELLANEOUS) ×4 IMPLANT
COVER FOOTSWITCH UNIV (MISCELLANEOUS) IMPLANT
ELECT REM PT RETURN 15FT ADLT (MISCELLANEOUS) ×4 IMPLANT
GLOVE BIOGEL M STRL SZ7.5 (GLOVE) ×4 IMPLANT
GOWN STRL REUS W/TWL LRG LVL3 (GOWN DISPOSABLE) ×4 IMPLANT
LOOP CUT BIPOLAR 24F LRG (ELECTROSURGICAL) IMPLANT
MANIFOLD NEPTUNE II (INSTRUMENTS) ×4 IMPLANT
PACK CYSTO (CUSTOM PROCEDURE TRAY) ×4 IMPLANT
SET ASPIRATION TUBING (TUBING) IMPLANT
SYRINGE IRR TOOMEY STRL 70CC (SYRINGE) IMPLANT
TUBING CONNECTING 10 (TUBING) ×3 IMPLANT
TUBING CONNECTING 10' (TUBING) ×1
TUBING UROLOGY SET (TUBING) ×4 IMPLANT

## 2018-02-09 NOTE — Anesthesia Preprocedure Evaluation (Addendum)
Anesthesia Evaluation  Patient identified by MRN, date of birth, ID band Patient awake    Reviewed: Allergy & Precautions, NPO status , Patient's Chart, lab work & pertinent test results, reviewed documented beta blocker date and time   Airway Mallampati: I  TM Distance: >3 FB Neck ROM: Full    Dental  (+) Edentulous Upper, Edentulous Lower   Pulmonary sleep apnea and Continuous Positive Airway Pressure Ventilation ,    Pulmonary exam normal breath sounds clear to auscultation       Cardiovascular hypertension, Pt. on medications and Pt. on home beta blockers + dysrhythmias Atrial Fibrillation + Valvular Problems/Murmurs MR  Rhythm:Irregular Rate:Normal - Systolic murmurs '19 TTE - Moderate LVH. EF 55% to 60%. Mild MR. LA was severely dilated. RA was moderately dilated. A small pericardial effusion was identified posterior to the heart.   Neuro/Psych negative neurological ROS  negative psych ROS   GI/Hepatic Neg liver ROS, Colon ca s/o left hemicolectomy   Endo/Other  negative endocrine ROS  Renal/GU CRFRenal disease   Bladder mass    Musculoskeletal negative musculoskeletal ROS (+)   Abdominal   Peds  Hematology  (+) anemia ,   Anesthesia Other Findings   Reproductive/Obstetrics                            Anesthesia Physical Anesthesia Plan  ASA: III  Anesthesia Plan: General   Post-op Pain Management:    Induction: Intravenous  PONV Risk Score and Plan: 3 and Treatment may vary due to age or medical condition, Ondansetron, Dexamethasone and Scopolamine patch - Pre-op  Airway Management Planned: Oral ETT and LMA  Additional Equipment: None  Intra-op Plan:   Post-operative Plan: Extubation in OR  Informed Consent: I have reviewed the patients History and Physical, chart, labs and discussed the procedure including the risks, benefits and alternatives for the proposed anesthesia  with the patient or authorized representative who has indicated his/her understanding and acceptance.   Dental advisory given  Plan Discussed with: CRNA and Anesthesiologist  Anesthesia Plan Comments: (ETT if surgeon requesting paralysis for case, otherwise LMA)        Anesthesia Quick Evaluation

## 2018-02-09 NOTE — Anesthesia Procedure Notes (Signed)
Procedure Name: Intubation Date/Time: 02/09/2018 12:58 PM Performed by: Montel Clock, CRNA Pre-anesthesia Checklist: Patient identified, Emergency Drugs available, Suction available, Patient being monitored and Timeout performed Patient Re-evaluated:Patient Re-evaluated prior to induction Oxygen Delivery Method: Circle system utilized Preoxygenation: Pre-oxygenation with 100% oxygen Induction Type: IV induction Ventilation: Mask ventilation without difficulty and Oral airway inserted - appropriate to patient size Laryngoscope Size: Mac and 3 Grade View: Grade II Tube type: Oral Tube size: 7.5 mm Number of attempts: 1 Airway Equipment and Method: Stylet Placement Confirmation: ETT inserted through vocal cords under direct vision,  positive ETCO2 and breath sounds checked- equal and bilateral Secured at: 21 cm Tube secured with: Tape Dental Injury: Teeth and Oropharynx as per pre-operative assessment

## 2018-02-09 NOTE — Transfer of Care (Signed)
Immediate Anesthesia Transfer of Care Note  Patient: Benjamin Chaney, Benjamin Chaney.  Procedure(s) Performed: TRANSURETHRAL RESECTION OF BLADDER TUMOR (TURBT) (N/A ) CYSTOGRAM  Patient Location: PACU  Anesthesia Type:General  Level of Consciousness: awake and patient cooperative  Airway & Oxygen Therapy: Patient Spontanous Breathing and Patient connected to face mask oxygen  Post-op Assessment: Report given to RN and Post -op Vital signs reviewed and stable  Post vital signs: Reviewed and stable  Last Vitals:  Vitals Value Taken Time  BP 114/79 02/09/2018  2:45 PM  Temp    Pulse 64 02/09/2018  2:47 PM  Resp 12 02/09/2018  2:47 PM  SpO2 100 % 02/09/2018  2:47 PM  Vitals shown include unvalidated device data.  Last Pain:  Vitals:   02/09/18 1044  TempSrc: Oral  PainSc:       Patients Stated Pain Goal: 3 (65/68/12 7517)  Complications: No apparent anesthesia complications

## 2018-02-09 NOTE — Interval H&P Note (Signed)
History and Physical Interval Note:  02/09/2018 12:27 PM  Benjamin Chaney.  has presented today for surgery, with the diagnosis of BLADDER MASS  The various methods of treatment have been discussed with the patient and family. After consideration of risks, benefits and other options for treatment, the patient has consented to  Procedure(s): TRANSURETHRAL RESECTION OF BLADDER TUMOR (TURBT) (N/A) as a surgical intervention .  The patient's history has been reviewed, patient examined, no change in status, stable for surgery.  I have reviewed the patient's chart and labs.  Questions were answered to the patient's satisfaction.     Marton Redwood, III

## 2018-02-09 NOTE — H&P (Signed)
H&P  Chief Complaint: bladder mass  History of Present Illness: HPI: 72 year old male who underwent a CT scan for decreased appetite, unexplained weight loss, history of colon polyps removed and a history of 1 month of diarrhea. This scan revealed a large mass that was contiguous with the dome of the bladder and filled the pelvis to the level of the umbilicus. This measured 11.5 x 12.7 x 12 cm. It extended into the right iliac fossa. The internal enhancement and vascularity. There was no pelvic lymphadenopathy. The patient has been having a lot of voiding symptoms including frequency, urgency, urinary leakage, nocturia, intermittency, hesitancy. He has not had gross hematuria. He has been told that he has microscopic hematuria. He has not had a colonoscopy recently that I can tell. According to chart notes, it looks like his colonoscopy was canceled after this big mass was found.      Past Medical History:  Diagnosis Date  . Atrial fibrillation ([place])    a. noted on EKG's in 2015 b. 12/2017: admitted with atrial fibrillation with RVR  . Cataract   . Chronic kidney disease, stage III (moderate) ([place]) 12/26/2012  . Colon cancer ([place]) 1997   left hemicolectomy   . Heart murmur   . Hypercholesterolemia   . Hypertension   . Microcytic anemia 12/19/2017  . Obstructive sleep apnea 09/14/2014   Past Surgical History:  Procedure Laterality Date  . BACK SURGERY    . BACTERIAL OVERGROWTH TEST N/A 10/01/2014   POSITIVE FOR SIBO  . COLON SURGERY    . COLONOSCOPY  2004   Dr. Lindalou Hose: normal  . COLONOSCOPY N/A 03/11/2014   NL ILEUM, & COLON Bx, POLYPOID LESIONS, SML IH, NL ANASTOMOSIS  . HERNIA REPAIR     in his 69s  . SPINE SURGERY      Home Medications:  Medications Prior to Admission  Medication Sig Dispense Refill Last Dose  . diltiazem (CARDIZEM CD) 360 MG 24 hr capsule Take 1 capsule (360 mg total) by mouth daily. 90 capsule 3 02/09/2018 at 0900  . dutasteride (AVODART) 0.5 MG capsule  Take 0.5 mg by mouth daily.   Past Week at Unknown time  . metoprolol tartrate (LOPRESSOR) 25 MG tablet Take 1 tablet (25 mg total) by mouth 2 (two) times daily. 180 tablet 3 02/09/2018 at 0400  . aspirin 325 MG tablet Take 325 mg by mouth daily.    02/04/2018  . dicyclomine (BENTYL) 10 MG capsule TAKE ONE TO TWO CAPSULES BY MOUTH BEFORE BREAKFAST AND  BEFORE  LUNCH  FOR  DIARRHEA  AS  NEEDED 90 capsule 5 02/04/2018  . furosemide (LASIX) 40 MG tablet Take 40 mg by mouth daily.    More than a month at Unknown time   Allergies: No Known Allergies  Family History  Problem Relation Age of Onset  . Heart disease Mother        died at 4  . COPD Mother   . Alcohol abuse Mother   . Alcohol abuse Father   . Diabetes Brother   . Hypertension Brother   . Kidney disease Brother        dialysis  . Diabetes Brother   . Colon cancer Neg Hx   . Colon polyps Neg Hx    Social History:  reports that he has never smoked. He has never used smokeless tobacco. He reports that he does not drink alcohol or use drugs.  ROS: A complete review of systems was performed.  All systems are  negative except for pertinent findings as noted. ROS   Physical Exam:  Vital signs in last 24 hours: Temp:  [97.9 F (36.6 C)] 97.9 F (36.6 C) (04/12 1044) Pulse Rate:  [54] 54 (04/12 1044) Resp:  [16] 16 (04/12 1044) BP: (117)/(87) 117/87 (04/12 1044) SpO2:  [99 %] 99 % (04/12 1044) Weight:  [69.9 kg (154 lb 2 oz)] 69.9 kg (154 lb 2 oz) (04/12 1042) General:  Alert and oriented, No acute distress HEENT: Normocephalic, atraumatic Neck: No JVD or lymphadenopathy Cardiovascular: Regular rate and rhythm Lungs: Regular rate and effort Abdomen: Soft, nontender, nondistended, no abdominal masses Back: No CVA tenderness Extremities: No edema Neurologic: Grossly intact  Laboratory Data:  No results found for this or any previous visit (from the past 24 hour(s)). No results found for this or any previous visit (from the  past 240 hour(s)). Creatinine: No results for input(s): CREATININE in the last 168 hours.  Impression/Assessment:  Bladder mass  Plan:  Proceed with TURBT for pathological diagnosis  Marton Redwood, III 02/09/2018, 12:26 PM

## 2018-02-09 NOTE — Discharge Instructions (Addendum)
Transurethral Resection of Bladder Tumor (TURBT) or Bladder Biopsy  Do not start back blood thinners until all bleeding has stopped for 2-3 days  Definition:  Transurethral Resection of the Bladder Tumor is a surgical procedure used to diagnose and remove tumors within the bladder. TURBT is the most common treatment for early stage bladder cancer.  General instructions:     Your recent bladder surgery requires very little post hospital care but some definite precautions.  Despite the fact that no skin incisions were used, the area around the bladder incisions are raw and covered with scabs to promote healing and prevent bleeding. Certain precautions are needed to insure that the scabs are not disturbed over the next 2-4 weeks while the healing proceeds.  Because the raw surface inside your bladder and the irritating effects of urine you may expect frequency of urination and/or urgency (a stronger desire to urinate) and perhaps even getting up at night more often. This will usually resolve or improve slowly over the healing period. You may see some blood in your urine over the first 6 weeks. Do not be alarmed, even if the urine was clear for a while. Get off your feet and drink lots of fluids until clearing occurs. If you start to pass clots or don't improve call us.  Diet:  You may return to your normal diet immediately. Because of the raw surface of your bladder, alcohol, spicy foods, foods high in acid and drinks with caffeine may cause irritation or frequency and should be used in moderation. To keep your urine flowing freely and avoid constipation, drink plenty of fluids during the day (8-10 glasses). Tip: Avoid cranberry juice because it is very acidic.  Activity:  Your physical activity doesn't need to be restricted. However, if you are very active, you may see some blood in the urine. We suggest that you reduce your activity under the circumstances until the bleeding has  stopped.  Bowels:  It is important to keep your bowels regular during the postoperative period. Straining with bowel movements can cause bleeding. A bowel movement every other day is reasonable. Use a mild laxative if needed, such as milk of magnesia 2-3 tablespoons, or 2 Dulcolax tablets. Call if you continue to have problems. If you had been taking narcotics for pain, before, during or after your surgery, you may be constipated. Take a laxative if necessary.    Medication:  You should resume your pre-surgery medications unless told not to. In addition you may be given an antibiotic to prevent or treat infection. Antibiotics are not always necessary. All medication should be taken as prescribed until the bottles are finished unless you are having an unusual reaction to one of the drugs.

## 2018-02-09 NOTE — Op Note (Addendum)
Operative Note  Preoperative diagnosis:  1.  Bladder mass-large  Postoperative diagnosis: 1.  Bladder mass-large 2.  Right sided large bladder diverticulum  Procedure(s): 1.  Transurethral resection of bladder tumor-large 2.  Cystogram 3.  Examination under anesthesia  Surgeon: Link Snuffer, MD  Assistants: None  Anesthesia: General  Complications: None immediate  EBL: 100 cc  Specimens: 1.  Bladder tumor 2.  Bladder tumor sample 3 3.  Bladder tumor sample 3  Drains/Catheters: 1.  None  Intraoperative findings: 1.  Normal urethra 2.  The entire anterior bladder wall was covered with a very large bladder tumor.  The posterior bladder wall appeared to be spared.  Along the tumor, there is a very large amount of adherent material possibly necrotic tissue that was evacuated without resection.  I was unable to resect some of the obvious bladder tumor that was very hypervascular and I collected these for 2 separate samples without the excess nonadherent material that appeared necrotic. 3.  To the right, there was a large bladder diverticulum.  The scope was not able to reach inside the entire diverticulum but I did not see an obvious mass inside of this.  I decided to perform a cystogram which revealed a markedly abnormal bladder but the right-sided diverticulum appeared to be smooth in nature.  I did not see any obvious extravasation of contrast outside of the bladder.  4.  Digital rectal exam revealed an approximately 40 g prostate without nodularity.  There is no obvious mass extending into the rectum.  There was a very large mass easily palpable on the anterior abdominal wall suprapubic area there was possibly adherent to the pubic bone.  It was not easily mobile but the mass is very large.  Indication: 72 year old male who underwent a CT scan for decreased appetite, unexplained weight loss, history of colon polyps removed and a history of 1 month of diarrhea. This scan revealed a  large mass that was contiguous with the dome of the bladder and filled the pelvis to the level of the umbilicus. This measured 11.5 x 12.7 x 12 cm. It extended into the right iliac fossa. The internal enhancement and vascularity. There was no pelvic lymphadenopathy. The patient has been having a lot of voiding symptoms including frequency, urgency, urinary leakage, nocturia, intermittency, hesitancy. He has not had gross hematuria. He has been told that he has microscopic hematuria. He has not had a colonoscopy recently that I can tell. According to chart notes, it looks like his colonoscopy was canceled after this big mass was found.   Description of procedure:  The patient was identified and consent was obtained.  The patient was taken to the operating room and placed in the supine position.  The patient was placed under general anesthesia.  Perioperative antibiotics were administered.  The patient was placed in dorsal lithotomy.  Patient was prepped and draped in a standard sterile fashion and a timeout was performed.  A 26 French resectoscope with the visual obturator in place was advanced into the urethra and into the bladder.  A large amount of necrotic tissue with obvious and this was evacuated from the bladder.  I was unable to completely evacuate all necrotic tissue.  I then proceeded to resect some of the mass which was very large.  It was very hypervascular and I carefully used electrocautery after each area of resection.  I resected several different areas to try to get a good sampling.  In addition to that, I collected some  specimen separately again to try to get a good sampling and to ensure proper diagnosis.  During resection, I encountered a large opening in the bladder on the right side.  I inspected it with the scope and it appeared to be a bladder diverticulum but I was unable to visualize the entire mucosa due to the size.  To confirm, I decided to perform a cystogram which confirmed a  right-sided large bladder diverticulum and a markedly abnormal irregular bladder.  After resecting a number of areas, I collected all the specimens.  I meticulously obtained hemostasis until there was no active bleeding with the water turned off.  I then drained the bladder and then reinspected the bladder mucosa and again confirmed that there was no active bleeding at this time.  Given the large amount of necrotic tissue, I decided not to leave a Foley catheter as I believe there would be a high likelihood that the catheter would become clogged with this necrotic tissue.  I will only place a catheter if the patient is unable to void.  After collecting all specimens, this concluded the operation.  The patient tolerated the procedure well and was stable postoperatively.  Plan: Follow-up in 1 week for pathology review.  Should hold blood thinner until he has no hematuria for at least 2-3 days.

## 2018-02-12 NOTE — Anesthesia Postprocedure Evaluation (Signed)
Anesthesia Post Note  Patient: Benjamin Chaney, Benjamin Chaney.  Procedure(s) Performed: TRANSURETHRAL RESECTION OF BLADDER TUMOR (TURBT) (N/A ) CYSTOGRAM     Patient location during evaluation: PACU Anesthesia Type: General Level of consciousness: awake and alert Pain management: pain level controlled Vital Signs Assessment: post-procedure vital signs reviewed and stable Respiratory status: spontaneous breathing, nonlabored ventilation and respiratory function stable Cardiovascular status: blood pressure returned to baseline and stable Postop Assessment: no apparent nausea or vomiting Anesthetic complications: no    Last Vitals:  Vitals:   02/09/18 1515 02/09/18 1608  BP: 125/85 103/84  Pulse: 70 81  Resp: 13 15  Temp: 36.7 C 36.6 C  SpO2: 100% 100%    Last Pain:  Vitals:   02/09/18 1608  TempSrc:   PainSc: 2    Pain Goal: Patients Stated Pain Goal: 3 (02/09/18 1042)               Audry Pili

## 2018-02-14 ENCOUNTER — Telehealth: Payer: Self-pay

## 2018-02-14 ENCOUNTER — Ambulatory Visit: Payer: Managed Care, Other (non HMO) | Admitting: Gastroenterology

## 2018-02-14 DIAGNOSIS — R109 Unspecified abdominal pain: Secondary | ICD-10-CM

## 2018-02-14 NOTE — Telephone Encounter (Signed)
Per [name], we can make a referral to surgeon and if pt worsens he should go to the ED. Pt is aware and I stressed the importance of going at once if he worsens. He doesn't have a preference for surgeon but said Linna Hoff is fine.

## 2018-02-14 NOTE — Telephone Encounter (Signed)
Pt came by the office and said he hurt all night last night from the abdominal hernias. Said he has 2, one on either side of unbilicous. Not hurting as much now as he did last night, but wants to know what to do. He has TURP on 02/09/2018 at [facility]. He has follow up appt at [facility] tomorrow.  He said the hernias are not worse since that procedure.  I will consult with [name] for recommendation.

## 2018-02-14 NOTE — Telephone Encounter (Signed)
Referral placed to surgery 

## 2018-02-14 NOTE — Telephone Encounter (Signed)
REVIEWED. PT RECENTLY HAD RESECTION OF A BLADDER TUMOR. IF HE IS HAVING WORSENING ABDOMINAL PAIN THEN HE NEEDS TO GO TO [facility] ED. PER PT REQUEST WILL REFER HIM TO HAVE VISIT WITH SURGERY REGARDING REPAIRING HIS VENTRAL HERNIA.

## 2018-02-19 NOTE — Telephone Encounter (Signed)
PT was made aware when he walked in to go to ED if he had worsening symptoms.

## 2018-02-21 ENCOUNTER — Telehealth: Payer: Self-pay | Admitting: Gastroenterology

## 2018-02-21 NOTE — Telephone Encounter (Signed)
We are not able to do the return to work authorization or forms. I did see him in Feb 2019 for loss of weight, but he was found to have a bladder mass and further GI work-up by [place] was postponed. He needs to take this to Urology. I have forms back here, we can give back to him. Thanks!

## 2018-02-21 NOTE — Telephone Encounter (Signed)
Pt is aware and will come by to pick up forms.

## 2018-02-21 NOTE — Telephone Encounter (Signed)
Noted  

## 2018-02-21 NOTE — Telephone Encounter (Signed)
Received a phone call from [place] at Dr. Arnoldo Morale office. She said pt does not want to have anything done about his hernias at this time since he is dealing with his bladder tumor. She just wanted to let [name] know that pt was declining at this time.

## 2018-02-21 NOTE — Telephone Encounter (Signed)
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 Patient can be reached at [phone] when done.

## 2018-02-22 ENCOUNTER — Other Ambulatory Visit: Payer: Self-pay | Admitting: Urology

## 2018-02-22 ENCOUNTER — Telehealth: Payer: Self-pay | Admitting: Gastroenterology

## 2018-02-22 ENCOUNTER — Other Ambulatory Visit: Payer: Self-pay | Admitting: Radiation Oncology

## 2018-02-22 DIAGNOSIS — C673 Malignant neoplasm of anterior wall of bladder: Secondary | ICD-10-CM

## 2018-02-22 NOTE — Telephone Encounter (Signed)
REVIEWED-NO ADDITIONAL RECOMMENDATIONS. 

## 2018-02-22 NOTE — Telephone Encounter (Signed)
[phone]  Patient came in and stated that the fmla paper needed to be filled out for the day he was here because he will lose his job.  Stated he came here then went to his urologist a few days later.

## 2018-02-27 NOTE — Telephone Encounter (Signed)
I really don't know how to fill this out the way he wants it. 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 I can fill out for only the day in February 2019 that he saw [place], but I have no idea what Urology has stated. Just need clarification on exactly what he needs for these forms. Would a letter not be appropriate? I do not want him to have any issues with his job, but I also want to make sure these forms are filled out correctly.

## 2018-02-27 NOTE — Telephone Encounter (Signed)
I called pt and he said he was at first on an intermittent leave and then a continuous leave. The last day that he got to try to work was 02/19/2018. He said with the cancer he is really not able to work. He is aware that we do not know what the Urologist told him about working. He said if Roseanne Kaufman, NP can just write a letter, that should be fine.

## 2018-03-01 ENCOUNTER — Encounter: Payer: Self-pay | Admitting: Gastroenterology

## 2018-03-01 NOTE — Progress Notes (Signed)
Letter complete stating when he was seen in our office.

## 2018-03-01 NOTE — Telephone Encounter (Signed)
Pt is aware that Benjamin Chaney has written a letter and I am leaving it at front with the other forms. He is aware that Benjamin Chaney said for him to get the Urologist to fill out those papers. He said he is just waiting on hearing back from them, they are supposed to schedule an MRI at some point.

## 2018-03-01 NOTE — Telephone Encounter (Signed)
I did a letter. Please strongly recommend him to take other forms to Urology and see if they can fill this out. What is going on now with his plan of care?

## 2018-03-05 ENCOUNTER — Ambulatory Visit (HOSPITAL_COMMUNITY)
Admission: RE | Admit: 2018-03-05 | Discharge: 2018-03-05 | Disposition: A | Discharge status: Home / Self Care | Payer: Managed Care, Other (non HMO) | Source: Ambulatory Visit | Attending: Radiation Oncology | Admitting: Radiation Oncology

## 2018-03-05 ENCOUNTER — Encounter (HOSPITAL_COMMUNITY): Payer: Self-pay

## 2018-03-05 DIAGNOSIS — C673 Malignant neoplasm of anterior wall of bladder: Secondary | ICD-10-CM

## 2018-03-05 MED ORDER — GADOBENATE DIMEGLUMINE 529 MG/ML IV SOLN: 15.0000 mL | Freq: Once | INTRAVENOUS | Status: DC | PRN

## 2018-03-07 ENCOUNTER — Other Ambulatory Visit: Payer: Self-pay | Admitting: Urology

## 2018-03-22 ENCOUNTER — Inpatient Hospital Stay: Admission: RE | Admit: 2018-03-22 | Payer: Managed Care, Other (non HMO) | Source: Ambulatory Visit | Admitting: Urology

## 2018-03-23 SURGERY — CYSTECTOMY, COMPLETE
Anesthesia: General

## 2018-04-05 ENCOUNTER — Encounter: Payer: Self-pay | Admitting: Family Medicine

## 2018-04-06 ENCOUNTER — Encounter: Payer: Self-pay | Admitting: Family Medicine

## 2018-05-15 ENCOUNTER — Ambulatory Visit: Payer: Managed Care, Other (non HMO) | Admitting: Cardiovascular Disease

## 2018-05-31 DEATH — deceased

## 2019-06-21 IMAGING — CT Abdomen^ABD_PELVIS_ROUTINE_WITH_APH (Adult)
2 of 5 series · 16 of 46 positions shown, 18 images · IV contrast (Isovue)
Comparison: None.

CLINICAL DATA: LOSS OF APPETITE WITH DIARRHEA X 1 MONTH. HX COLON
POLYPS REMOVED. ^75mL T2B68R-E55 IOPAMIDOL (T2B68R-E55) INJECTION
61%

EXAM:
CT ABDOMEN AND PELVIS WITH CONTRAST
TECHNIQUE: Multidetector CT imaging of the abdomen and pelvis was performed
using the standard protocol following bolus administration of
intravenous contrast.
CONTRAST:  75mL T2B68R-E55 IOPAMIDOL (T2B68R-E55) INJECTION 61%

[Series 2: axial st · axial · 0.64mm/px · z∈[-375,-25]mm · 13 of 80 slices shown, 15 images]
[im 5/80  soft-tissue]
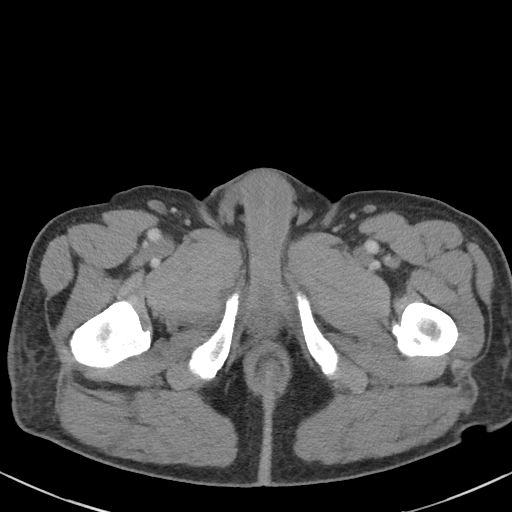
[im 5/80  bone]
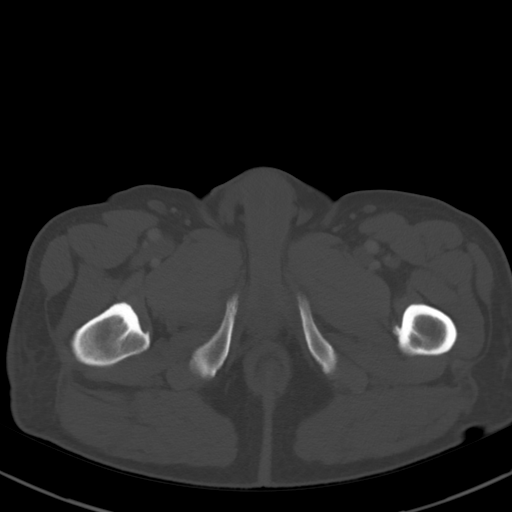
[im 10/80  soft-tissue]
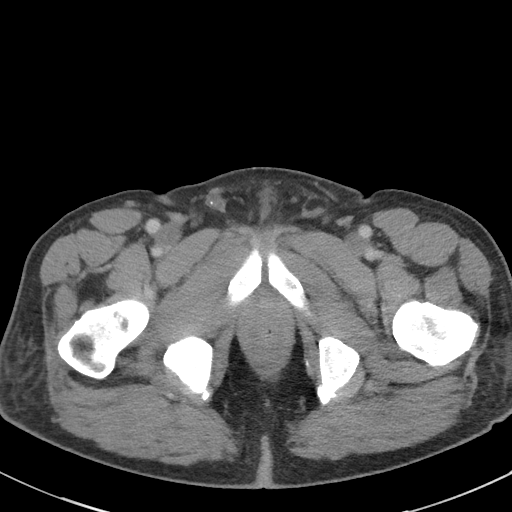
[im 19/80  soft-tissue]
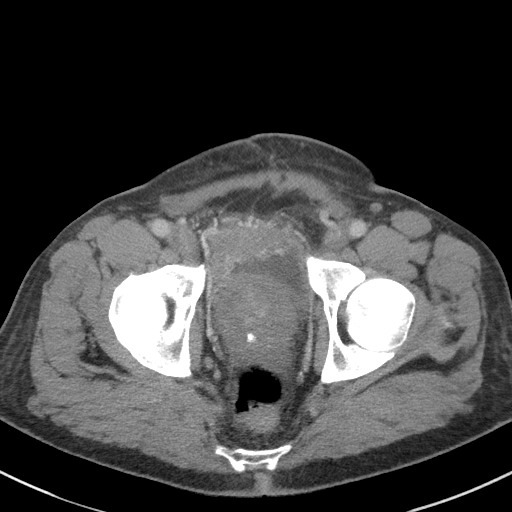
[im 24/80  soft-tissue]
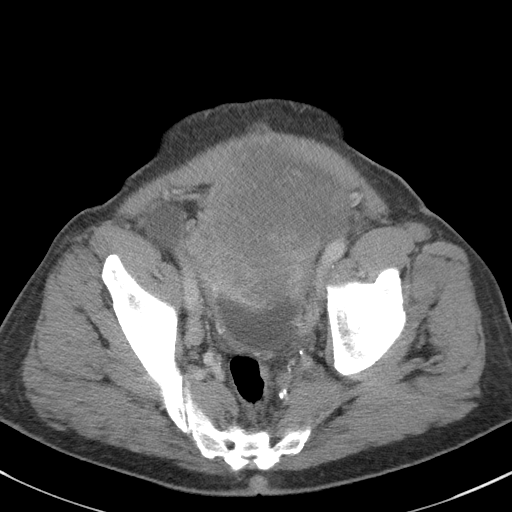
[im 28/80  soft-tissue]
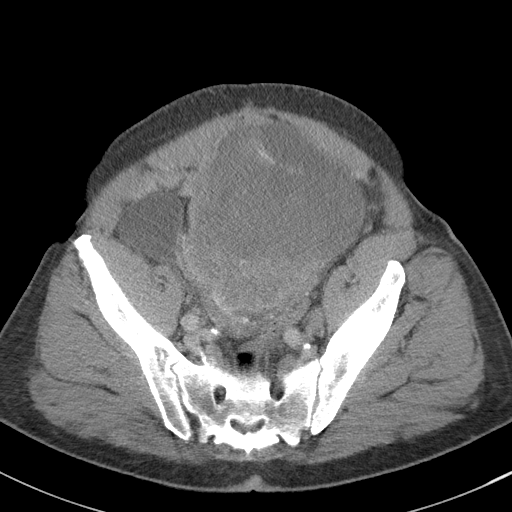
[im 33/80  soft-tissue]
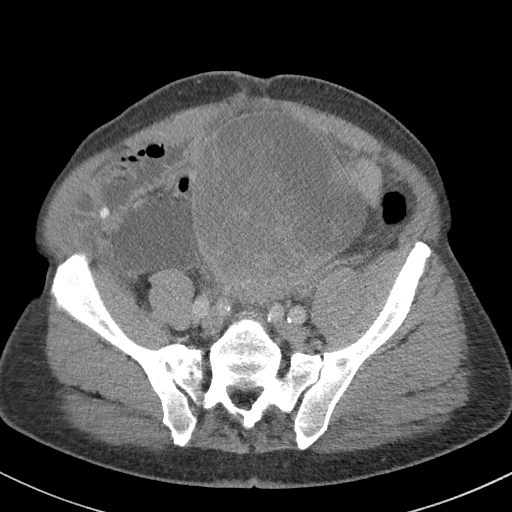
[im 42/80  soft-tissue]
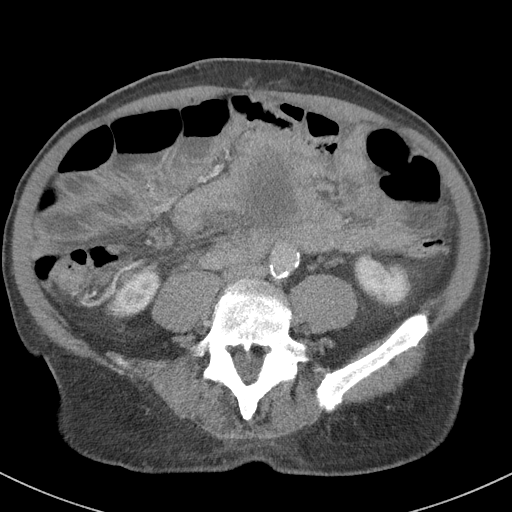
[im 47/80  soft-tissue]
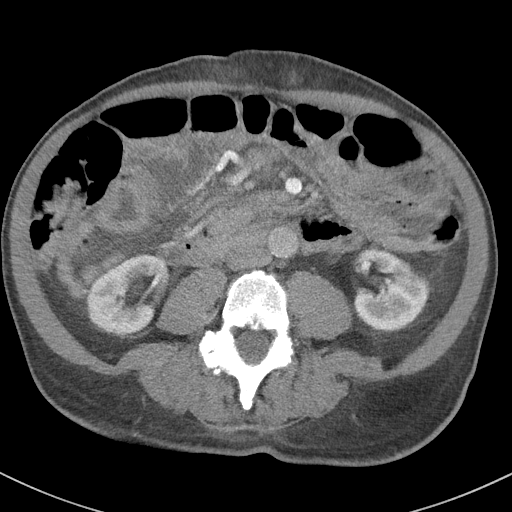
[im 52/80  soft-tissue]
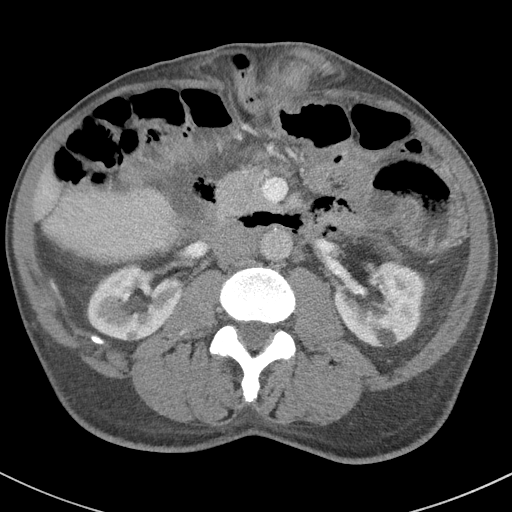
[im 52/80  bone]
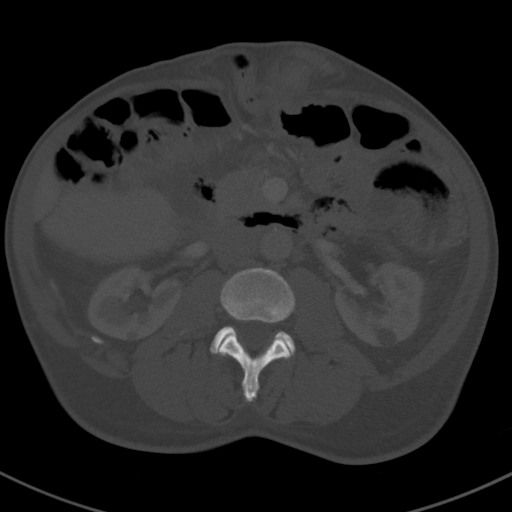
[im 56/80  soft-tissue]
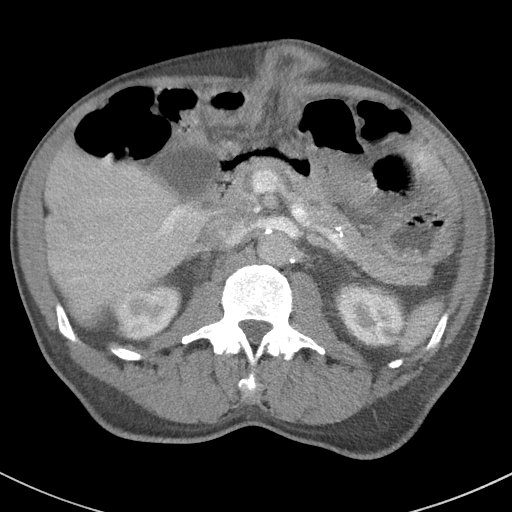
[im 61/80  soft-tissue]
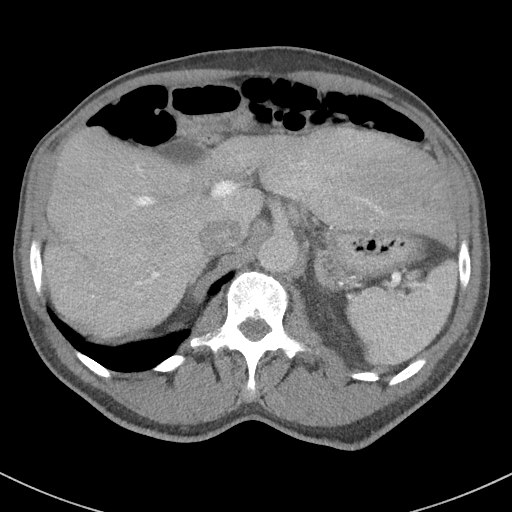
[im 70/80  soft-tissue]
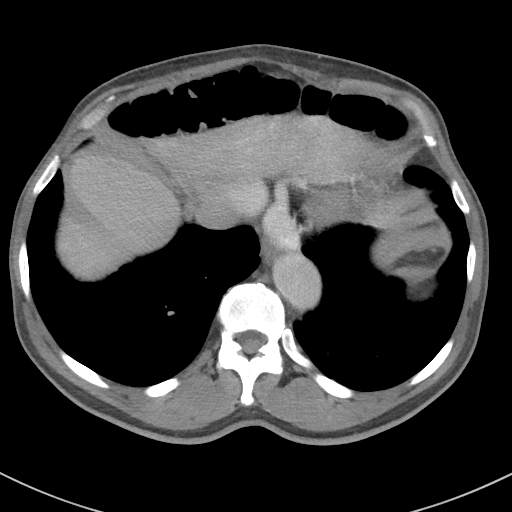
[im 75/80  soft-tissue]
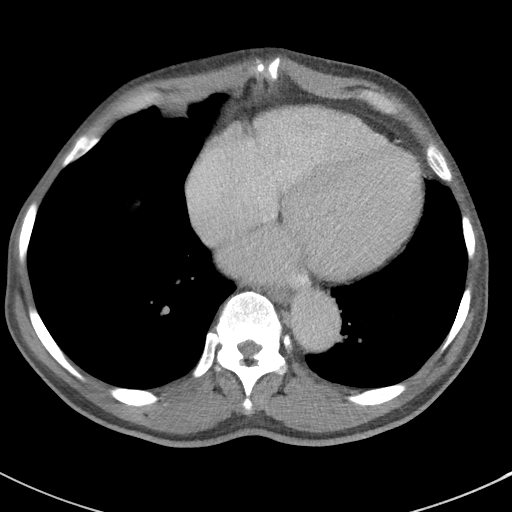

[Series 4: coronal st · coronal · 0.67mm/px · 3 of 98 slices shown]
[im 33/98  soft-tissue]
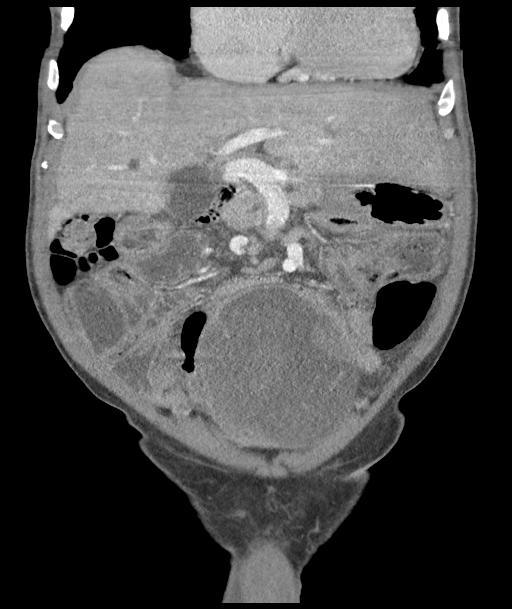
[im 44/98  soft-tissue]
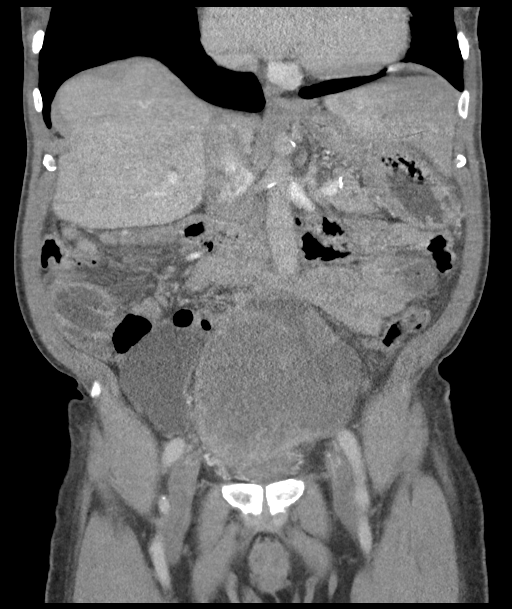
[im 54/98  soft-tissue]
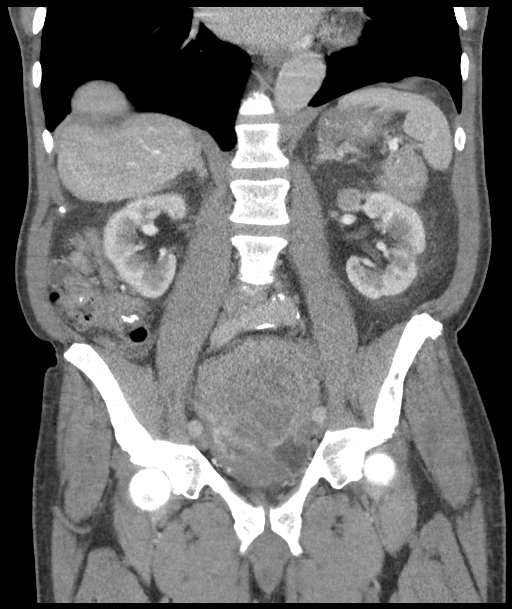

[16 of 46 positions shown; findings below may reference images not displayed]

FINDINGS: Lower chest: Lung bases are clear.

Hepatobiliary: Small hepatic cysts.  Gallbladder

Pancreas: Pancreatic duct is prominent but felt normal limits. No
mass or.

Spleen: Normal spleen

Adrenals/urinary tract: Adrenal glands are normal. Small bilateral
renal cysts noted. Ureters grossly

There is a large mass contiguous with the dome of the bladder
extending out of pelvis the level of the umbilicus. Mass measures
11.5 by 12.7 12.0 cm (volume = 918 cm^3). Mass compresses the
bladder a portion of compressed bladder extending into the RIGHT
iliac fossa (image 50, series 2) Mass relatively homogeneous with
internal enhancement and vascularity. On sagittal projection
difficulty cell in mass arises from the dome of the bladder wall or
compresses the dome. Bladder wall enhancing adjacent mass

Stomach/Bowel: Stomach duodenum small-bowel normal. No obstruction.
Loop of small bowel does enter ventral hernia without evidence
obstruction. Hernia superior to the umbilicus (image 30, series

Vascular/Lymphatic: No evidence of pelvic lymphadenopathy. No
periaortic adenopathy calcification

Reproductive: Prostate gland is enlarged the base bladder measuring
5.7 cm in diameter

Other: No free fluid.

Musculoskeletal: No aggressive osseous lesion.
IMPRESSION: 1. Large mass contiguous with the dome of the bladder arises out of
the pelvis to level of the umbilicus. Difficult to determine if
lesion compresses or arises from the bladder or both. Differential
would include primary bladder wall neoplasm, atypical urachal
carcinoma, GI stromal tumor, sarcoma or lymphoma.
2. Ventral abdominal hernia contains nonobstructed loops small
bowel.
3. Prostate hypertrophy.
